# Patient Record
Sex: Male | Born: 1975 | Race: Black or African American | Hispanic: No | Marital: Single | Smoking: Never smoker
Health system: Southern US, Community
[De-identification: ages and names within clinical notes are randomized; demographics above are authoritative.]

## PROBLEM LIST (undated history)

## (undated) DIAGNOSIS — R200 Anesthesia of skin: Secondary | ICD-10-CM

## (undated) DIAGNOSIS — G40802 Other epilepsy, not intractable, without status epilepticus: Secondary | ICD-10-CM

## (undated) HISTORY — DX: Anesthesia of skin: R20.0

## (undated) HISTORY — PX: NO PAST SURGERIES: SHX2092

---

## 2016-01-30 DIAGNOSIS — M542 Cervicalgia: Secondary | ICD-10-CM | POA: Diagnosis not present

## 2016-01-30 DIAGNOSIS — M546 Pain in thoracic spine: Secondary | ICD-10-CM | POA: Diagnosis not present

## 2016-01-30 DIAGNOSIS — E785 Hyperlipidemia, unspecified: Secondary | ICD-10-CM | POA: Diagnosis not present

## 2016-03-15 DIAGNOSIS — R251 Tremor, unspecified: Secondary | ICD-10-CM | POA: Diagnosis not present

## 2016-03-15 DIAGNOSIS — R200 Anesthesia of skin: Secondary | ICD-10-CM | POA: Diagnosis not present

## 2016-03-16 ENCOUNTER — Other Ambulatory Visit: Payer: Self-pay | Admitting: Neurology

## 2016-03-16 DIAGNOSIS — R200 Anesthesia of skin: Secondary | ICD-10-CM

## 2016-04-02 DIAGNOSIS — R200 Anesthesia of skin: Secondary | ICD-10-CM | POA: Diagnosis not present

## 2016-04-06 ENCOUNTER — Ambulatory Visit: Payer: Self-pay

## 2016-04-30 ENCOUNTER — Ambulatory Visit
Admission: RE | Admit: 2016-04-30 | Discharge: 2016-04-30 | Disposition: A | Discharge status: Home / Self Care | Payer: 59 | Source: Ambulatory Visit | Attending: Neurology | Admitting: Neurology

## 2016-04-30 DIAGNOSIS — R200 Anesthesia of skin: Secondary | ICD-10-CM | POA: Insufficient documentation

## 2016-04-30 DIAGNOSIS — R208 Other disturbances of skin sensation: Secondary | ICD-10-CM | POA: Diagnosis not present

## 2016-04-30 MED ORDER — GADOBENATE DIMEGLUMINE 529 MG/ML IV SOLN
15.0000 mL | Freq: Once | INTRAVENOUS | Status: AC | PRN
  Administered 2016-04-30: 15 mL via INTRAVENOUS

## 2016-05-07 DIAGNOSIS — M545 Low back pain: Secondary | ICD-10-CM | POA: Diagnosis not present

## 2016-05-07 DIAGNOSIS — G63 Polyneuropathy in diseases classified elsewhere: Secondary | ICD-10-CM | POA: Diagnosis not present

## 2016-05-07 DIAGNOSIS — E785 Hyperlipidemia, unspecified: Secondary | ICD-10-CM | POA: Diagnosis not present

## 2016-05-14 DIAGNOSIS — R200 Anesthesia of skin: Secondary | ICD-10-CM | POA: Diagnosis not present

## 2016-05-14 DIAGNOSIS — R251 Tremor, unspecified: Secondary | ICD-10-CM | POA: Diagnosis not present

## 2016-05-24 DIAGNOSIS — M542 Cervicalgia: Secondary | ICD-10-CM | POA: Diagnosis not present

## 2016-05-24 DIAGNOSIS — M545 Low back pain: Secondary | ICD-10-CM | POA: Diagnosis not present

## 2016-06-07 DIAGNOSIS — G40109 Localization-related (focal) (partial) symptomatic epilepsy and epileptic syndromes with simple partial seizures, not intractable, without status epilepticus: Secondary | ICD-10-CM | POA: Diagnosis not present

## 2016-06-14 DIAGNOSIS — G6289 Other specified polyneuropathies: Secondary | ICD-10-CM | POA: Diagnosis not present

## 2016-06-14 DIAGNOSIS — E785 Hyperlipidemia, unspecified: Secondary | ICD-10-CM | POA: Diagnosis not present

## 2016-07-05 DIAGNOSIS — M5416 Radiculopathy, lumbar region: Secondary | ICD-10-CM | POA: Diagnosis not present

## 2016-07-05 DIAGNOSIS — M9903 Segmental and somatic dysfunction of lumbar region: Secondary | ICD-10-CM | POA: Diagnosis not present

## 2016-07-05 DIAGNOSIS — M9905 Segmental and somatic dysfunction of pelvic region: Secondary | ICD-10-CM | POA: Diagnosis not present

## 2016-07-05 DIAGNOSIS — M5432 Sciatica, left side: Secondary | ICD-10-CM | POA: Diagnosis not present

## 2016-07-06 DIAGNOSIS — M9903 Segmental and somatic dysfunction of lumbar region: Secondary | ICD-10-CM | POA: Diagnosis not present

## 2016-07-06 DIAGNOSIS — M5416 Radiculopathy, lumbar region: Secondary | ICD-10-CM | POA: Diagnosis not present

## 2016-07-06 DIAGNOSIS — M9905 Segmental and somatic dysfunction of pelvic region: Secondary | ICD-10-CM | POA: Diagnosis not present

## 2016-07-06 DIAGNOSIS — M5432 Sciatica, left side: Secondary | ICD-10-CM | POA: Diagnosis not present

## 2016-07-09 DIAGNOSIS — M9903 Segmental and somatic dysfunction of lumbar region: Secondary | ICD-10-CM | POA: Diagnosis not present

## 2016-07-09 DIAGNOSIS — M5416 Radiculopathy, lumbar region: Secondary | ICD-10-CM | POA: Diagnosis not present

## 2016-07-09 DIAGNOSIS — M5432 Sciatica, left side: Secondary | ICD-10-CM | POA: Diagnosis not present

## 2016-07-09 DIAGNOSIS — M9905 Segmental and somatic dysfunction of pelvic region: Secondary | ICD-10-CM | POA: Diagnosis not present

## 2016-07-11 DIAGNOSIS — M9905 Segmental and somatic dysfunction of pelvic region: Secondary | ICD-10-CM | POA: Diagnosis not present

## 2016-07-11 DIAGNOSIS — M9903 Segmental and somatic dysfunction of lumbar region: Secondary | ICD-10-CM | POA: Diagnosis not present

## 2016-07-11 DIAGNOSIS — M5416 Radiculopathy, lumbar region: Secondary | ICD-10-CM | POA: Diagnosis not present

## 2016-07-11 DIAGNOSIS — M5432 Sciatica, left side: Secondary | ICD-10-CM | POA: Diagnosis not present

## 2016-07-13 DIAGNOSIS — M5416 Radiculopathy, lumbar region: Secondary | ICD-10-CM | POA: Diagnosis not present

## 2016-07-13 DIAGNOSIS — M9903 Segmental and somatic dysfunction of lumbar region: Secondary | ICD-10-CM | POA: Diagnosis not present

## 2016-07-13 DIAGNOSIS — M5432 Sciatica, left side: Secondary | ICD-10-CM | POA: Diagnosis not present

## 2016-07-13 DIAGNOSIS — M9905 Segmental and somatic dysfunction of pelvic region: Secondary | ICD-10-CM | POA: Diagnosis not present

## 2016-07-18 DIAGNOSIS — M9905 Segmental and somatic dysfunction of pelvic region: Secondary | ICD-10-CM | POA: Diagnosis not present

## 2016-07-18 DIAGNOSIS — M5432 Sciatica, left side: Secondary | ICD-10-CM | POA: Diagnosis not present

## 2016-07-18 DIAGNOSIS — M5416 Radiculopathy, lumbar region: Secondary | ICD-10-CM | POA: Diagnosis not present

## 2016-07-18 DIAGNOSIS — M9903 Segmental and somatic dysfunction of lumbar region: Secondary | ICD-10-CM | POA: Diagnosis not present

## 2016-07-19 DIAGNOSIS — M9903 Segmental and somatic dysfunction of lumbar region: Secondary | ICD-10-CM | POA: Diagnosis not present

## 2016-07-19 DIAGNOSIS — M5416 Radiculopathy, lumbar region: Secondary | ICD-10-CM | POA: Diagnosis not present

## 2016-07-19 DIAGNOSIS — M5432 Sciatica, left side: Secondary | ICD-10-CM | POA: Diagnosis not present

## 2016-07-19 DIAGNOSIS — M9905 Segmental and somatic dysfunction of pelvic region: Secondary | ICD-10-CM | POA: Diagnosis not present

## 2016-07-20 DIAGNOSIS — M9903 Segmental and somatic dysfunction of lumbar region: Secondary | ICD-10-CM | POA: Diagnosis not present

## 2016-07-20 DIAGNOSIS — M5432 Sciatica, left side: Secondary | ICD-10-CM | POA: Diagnosis not present

## 2016-07-20 DIAGNOSIS — M5416 Radiculopathy, lumbar region: Secondary | ICD-10-CM | POA: Diagnosis not present

## 2016-07-20 DIAGNOSIS — R200 Anesthesia of skin: Secondary | ICD-10-CM | POA: Diagnosis not present

## 2016-07-20 DIAGNOSIS — M9905 Segmental and somatic dysfunction of pelvic region: Secondary | ICD-10-CM | POA: Diagnosis not present

## 2016-08-21 DIAGNOSIS — R569 Unspecified convulsions: Secondary | ICD-10-CM | POA: Diagnosis not present

## 2016-08-28 DIAGNOSIS — R200 Anesthesia of skin: Secondary | ICD-10-CM | POA: Diagnosis not present

## 2016-09-10 DIAGNOSIS — E785 Hyperlipidemia, unspecified: Secondary | ICD-10-CM | POA: Diagnosis not present

## 2016-09-10 DIAGNOSIS — G63 Polyneuropathy in diseases classified elsewhere: Secondary | ICD-10-CM | POA: Diagnosis not present

## 2016-09-13 DIAGNOSIS — E785 Hyperlipidemia, unspecified: Secondary | ICD-10-CM | POA: Diagnosis not present

## 2016-09-13 DIAGNOSIS — G63 Polyneuropathy in diseases classified elsewhere: Secondary | ICD-10-CM | POA: Diagnosis not present

## 2016-09-17 ENCOUNTER — Ambulatory Visit (INDEPENDENT_AMBULATORY_CARE_PROVIDER_SITE_OTHER): Payer: 59 | Admitting: Neurology

## 2016-09-17 ENCOUNTER — Encounter: Payer: Self-pay | Admitting: Neurology

## 2016-09-17 VITALS — BP 123/77 | HR 77 | Ht 70.0 in | Wt 153.5 lb

## 2016-09-17 DIAGNOSIS — R202 Paresthesia of skin: Secondary | ICD-10-CM | POA: Diagnosis not present

## 2016-09-17 DIAGNOSIS — R200 Anesthesia of skin: Secondary | ICD-10-CM | POA: Diagnosis not present

## 2016-09-17 MED ORDER — DIVALPROEX SODIUM ER 500 MG PO TB24: 500.0000 mg | ORAL_TABLET | Freq: Every day | ORAL | 11 refills | Status: DC

## 2016-09-17 NOTE — Progress Notes (Signed)
PATIENT: Henry Conley DOB: Jul 06, 1976  Chief Complaint  Patient presents with  . Left Leg Numbness    Reports intermittent left leg numbness that started in October 2016.  He has had a normal NCV/EMG and MRI.  He had an abnormal EEG.  He was placed on Gabapentin and Keppra but says he was instructed to stopped both medications.     HISTORICAL  Henry Conley is a 40 years old right-handed male, seen in refer by her primary care doctor Pasty Spillers McLean-Scocuzza for evaluation of intermittent left lower extremity numbness.  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 Marla Roe, I reviewed and summarized office note, laboratory evaluation showed no significant etiology, normal or negative vitamin B12, folic acid, HIV, vitamin D, MRI of the brain in May first 2017 was normal, nerve conduction study in April 2017 was normal, EEG in June 2017 showed presence of left frontal 6-7 Hz theta slowing, but without evidence of epileptiform discharge  Prolonged EEG on August 21 2016 was normal,  He was treated with Keppra up to 1000 mg twice a day without helping his spells, also previously tried gabapentin without helping  RVIEW OF SYSTEMS: Full 14 system review of systems performed and notable only for as above  ALLERGIES: Not on File  HOME MEDICATIONS: Current Outpatient Prescriptions  Medication Sig Dispense Refill  . Multiple Vitamin (MULTI-VITAMINS) TABS Take by mouth.     No current facility-administered medications for this visit.      PAST MEDICAL HISTORY: Past Medical History:  Diagnosis Date  . Left leg numbness     PAST SURGICAL HISTORY: History reviewed. No pertinent surgical history.  FAMILY HISTORY: Family History  Problem Relation Age of Onset  . Heart disease Mother   . Healthy Father     SOCIAL HISTORY:  Social History   Social History  . Marital status: Single    Spouse name: N/A  . Number of children: 0  . Years of education: HS   Occupational History  . Cook at [employer]    Social History Main Topics  . Smoking status: Never Smoker  . Smokeless tobacco: Never Used  . Alcohol use No  . Drug use: No  . Sexual activity: Not on file   Other Topics Concern  . Not on file   Social History Narrative   Lives at home alone.   Right-handed.   Occasional soda.     PHYSICAL EXAM   Vitals:   09/17/16 0916  BP: 123/77  Pulse: 77  Weight: 153 lb 8 oz (69.6 kg)  Height: 5\' 10"  (1.778 m)    Not recorded      Body mass index is 22.02 kg/m.  PHYSICAL EXAMNIATION:  Gen: NAD, conversant, well nourised, obese, well groomed                     Cardiovascular: Regular rate rhythm, no peripheral edema, warm, nontender. Eyes: Conjunctivae clear without exudates or hemorrhage Neck: Supple, no carotid bruise. Pulmonary: Clear to auscultation  bilaterally   NEUROLOGICAL EXAM:  MENTAL STATUS: Speech:    Speech is normal; fluent and spontaneous with normal comprehension.  Cognition:     Orientation to time, place and person     Normal recent and remote memory     Normal Attention span and concentration     Normal Language, naming, repeating,spontaneous speech     Fund of knowledge   CRANIAL NERVES: CN II: Visual fields are full to confrontation. Fundoscopic exam is normal with sharp discs and no vascular changes. Pupils are round equal and briskly reactive to light. CN III, IV, VI: extraocular movement are normal. No ptosis. CN V: Facial sensation is intact to pinprick  in all 3 divisions bilaterally. Corneal responses are intact.  CN VII: Face is symmetric with normal eye closure and smile. CN VIII: Hearing is normal to rubbing fingers CN IX, X: Palate elevates symmetrically. Phonation is normal. CN XI: Head turning and shoulder shrug are intact CN XII: Tongue is midline with normal movements and no atrophy.  MOTOR: There is no pronator drift of out-stretched arms. Muscle bulk and tone are normal. Muscle strength is normal.  REFLEXES: Reflexes are 2+ and symmetric at the biceps, triceps, knees, and ankles. Plantar responses are flexor.  SENSORY: Intact to light touch, pinprick, positional sensation and vibratory sensation are intact in fingers and toes.  COORDINATION: Rapid alternating movements and fine finger movements are intact. There is no dysmetria on finger-to-nose and heel-knee-shin.    GAIT/STANCE: Posture is normal. Gait is steady with normal steps, base, arm swing, and turning. Heel and toe walking are normal. Tandem gait is normal.  Romberg is absent.   DIAGNOSTIC DATA (LABS, IMAGING, TESTING) - I reviewed patient records, labs, notes, testing and imaging myself where available.   ASSESSMENT AND PLAN  Henry Conley is a 40 y.o. male   Recurrent episode of transient left leg numbness  Differentiation diagnosis simple seizure versus left lumbar radiculopathy  Will try Depakote ER 500 mg every day  MRI of lumbar  Levert FeinsteinYijun Jamael Hoffmann, M.D. Ph.D.  [facility] [address], [address] [place], [place] [address] Ph: [phone]) [phone] Fax: [phone]  ZH:YQMVHCC:Tracy N McLean-Scocuzza, MD

## 2016-09-30 ENCOUNTER — Encounter: Payer: Self-pay | Admitting: Emergency Medicine

## 2016-09-30 ENCOUNTER — Inpatient Hospital Stay
Admission: EM | Admit: 2016-09-30 | Discharge: 2016-10-02 | DRG: 100 | Disposition: A | Discharge status: Home / Self Care | Payer: 59 | Attending: Internal Medicine | Admitting: Internal Medicine

## 2016-09-30 ENCOUNTER — Emergency Department: Payer: 59

## 2016-09-30 DIAGNOSIS — G9340 Encephalopathy, unspecified: Secondary | ICD-10-CM | POA: Diagnosis present

## 2016-09-30 DIAGNOSIS — R4182 Altered mental status, unspecified: Secondary | ICD-10-CM | POA: Diagnosis not present

## 2016-09-30 DIAGNOSIS — G40909 Epilepsy, unspecified, not intractable, without status epilepticus: Secondary | ICD-10-CM | POA: Diagnosis not present

## 2016-09-30 DIAGNOSIS — R7309 Other abnormal glucose: Secondary | ICD-10-CM | POA: Diagnosis not present

## 2016-09-30 DIAGNOSIS — R410 Disorientation, unspecified: Secondary | ICD-10-CM | POA: Diagnosis not present

## 2016-09-30 DIAGNOSIS — E875 Hyperkalemia: Secondary | ICD-10-CM | POA: Diagnosis not present

## 2016-09-30 DIAGNOSIS — G40802 Other epilepsy, not intractable, without status epilepticus: Secondary | ICD-10-CM

## 2016-09-30 DIAGNOSIS — I6783 Posterior reversible encephalopathy syndrome: Secondary | ICD-10-CM | POA: Diagnosis not present

## 2016-09-30 DIAGNOSIS — R200 Anesthesia of skin: Secondary | ICD-10-CM | POA: Diagnosis present

## 2016-09-30 DIAGNOSIS — G9349 Other encephalopathy: Secondary | ICD-10-CM | POA: Diagnosis not present

## 2016-09-30 LAB — COMPREHENSIVE METABOLIC PANEL
ALK PHOS: 65 U/L (ref 38–126)
ALT: 37 U/L (ref 17–63)
ANION GAP: 8 (ref 5–15)
AST: 39 U/L (ref 15–41)
Albumin: 4.3 g/dL (ref 3.5–5.0)
BUN: 11 mg/dL (ref 6–20)
CALCIUM: 9.5 mg/dL (ref 8.9–10.3)
CHLORIDE: 106 mmol/L (ref 101–111)
CO2: 24 mmol/L (ref 22–32)
Creatinine, Ser: 1 mg/dL (ref 0.61–1.24)
Glucose, Bld: 115 mg/dL — ABNORMAL HIGH (ref 65–99)
Potassium: 6.9 mmol/L (ref 3.5–5.1)
SODIUM: 138 mmol/L (ref 135–145)
Total Bilirubin: 1.6 mg/dL — ABNORMAL HIGH (ref 0.3–1.2)
Total Protein: 8.3 g/dL — ABNORMAL HIGH (ref 6.5–8.1)

## 2016-09-30 LAB — CBC WITH DIFFERENTIAL/PLATELET
Basophils Absolute: 0.1 10*3/uL (ref 0–0.1)
Basophils Relative: 1 %
EOS ABS: 0.9 10*3/uL — AB (ref 0–0.7)
EOS PCT: 14 %
HCT: 53.1 % — ABNORMAL HIGH (ref 40.0–52.0)
Hemoglobin: 18 g/dL (ref 13.0–18.0)
LYMPHS ABS: 2 10*3/uL (ref 1.0–3.6)
Lymphocytes Relative: 33 %
MCH: 32.1 pg (ref 26.0–34.0)
MCHC: 33.9 g/dL (ref 32.0–36.0)
MCV: 94.7 fL (ref 80.0–100.0)
MONOS PCT: 9 %
Monocytes Absolute: 0.5 10*3/uL (ref 0.2–1.0)
Neutro Abs: 2.5 10*3/uL (ref 1.4–6.5)
Neutrophils Relative %: 43 %
PLATELETS: 243 10*3/uL (ref 150–440)
RBC: 5.6 MIL/uL (ref 4.40–5.90)
RDW: 12.7 % (ref 11.5–14.5)
WBC: 5.9 10*3/uL (ref 3.8–10.6)

## 2016-09-30 LAB — BLOOD GAS, VENOUS
Acid-Base Excess: 5.8 mmol/L — ABNORMAL HIGH (ref 0.0–2.0)
BICARBONATE: 31.2 mmol/L — AB (ref 20.0–28.0)
PH VEN: 7.43 (ref 7.250–7.430)
Patient temperature: 37
pCO2, Ven: 47 mmHg (ref 44.0–60.0)
pO2, Ven: <31 mmHg — CL (ref 32.0–45.0)

## 2016-09-30 LAB — SEDIMENTATION RATE: Sed Rate: 13 mm/hr (ref 0–15)

## 2016-09-30 LAB — BASIC METABOLIC PANEL
ANION GAP: 8 (ref 5–15)
BUN: 11 mg/dL (ref 6–20)
CALCIUM: 9.6 mg/dL (ref 8.9–10.3)
CO2: 27 mmol/L (ref 22–32)
Chloride: 105 mmol/L (ref 101–111)
Creatinine, Ser: 0.96 mg/dL (ref 0.61–1.24)
GFR calc non Af Amer: >60 mL/min (ref >60)
GLUCOSE: 62 mg/dL — AB (ref 65–99)
POTASSIUM: 3.8 mmol/L (ref 3.5–5.1)
Sodium: 140 mmol/L (ref 135–145)

## 2016-09-30 LAB — RAPID HIV SCREEN (HIV 1/2 AB+AG)
HIV 1/2 ANTIBODIES: NONREACTIVE
HIV-1 P24 Antigen - HIV24: NONREACTIVE

## 2016-09-30 LAB — GLUCOSE, CAPILLARY
GLUCOSE-CAPILLARY: 101 mg/dL — AB (ref 65–99)
Glucose-Capillary: 104 mg/dL — ABNORMAL HIGH (ref 65–99)
Glucose-Capillary: 77 mg/dL (ref 65–99)
Glucose-Capillary: 121 mg/dL — ABNORMAL HIGH (ref 65–99)

## 2016-09-30 LAB — TROPONIN I

## 2016-09-30 LAB — AMMONIA: AMMONIA: 31 umol/L (ref 9–35)

## 2016-09-30 LAB — CK: Total CK: 220 U/L (ref 49–397)

## 2016-09-30 MED ORDER — ACETAMINOPHEN 650 MG RE SUPP: 650.0000 mg | Freq: Four times a day (QID) | RECTAL | Status: DC | PRN

## 2016-09-30 MED ORDER — ONDANSETRON HCL 4 MG PO TABS: 4.0000 mg | ORAL_TABLET | Freq: Four times a day (QID) | ORAL | Status: DC | PRN

## 2016-09-30 MED ORDER — ASPIRIN EC 81 MG PO TBEC
81.0000 mg | DELAYED_RELEASE_TABLET | Freq: Every day | ORAL | Status: DC
  Administered 2016-09-30 – 2016-10-02 (×3): 81 mg via ORAL
  Filled 2016-09-30 (×3): qty 1

## 2016-09-30 MED ORDER — BISACODYL 10 MG RE SUPP: 10.0000 mg | Freq: Every day | RECTAL | Status: DC | PRN

## 2016-09-30 MED ORDER — PANTOPRAZOLE SODIUM 40 MG PO TBEC
40.0000 mg | DELAYED_RELEASE_TABLET | Freq: Every day | ORAL | Status: DC
  Administered 2016-09-30 – 2016-10-02 (×3): 40 mg via ORAL
  Filled 2016-09-30 (×3): qty 1

## 2016-09-30 MED ORDER — ONDANSETRON HCL 4 MG/2ML IJ SOLN: 4.0000 mg | Freq: Four times a day (QID) | INTRAMUSCULAR | Status: DC | PRN

## 2016-09-30 MED ORDER — ALBUTEROL SULFATE (2.5 MG/3ML) 0.083% IN NEBU
5.0000 mg | INHALATION_SOLUTION | Freq: Once | RESPIRATORY_TRACT | Status: AC
  Administered 2016-09-30: 5 mg via RESPIRATORY_TRACT
  Filled 2016-09-30: qty 6

## 2016-09-30 MED ORDER — INSULIN ASPART 100 UNIT/ML ~~LOC~~ SOLN
6.0000 [IU] | Freq: Once | SUBCUTANEOUS | Status: AC
  Administered 2016-09-30: 6 [IU] via INTRAVENOUS
  Filled 2016-09-30: qty 6

## 2016-09-30 MED ORDER — SODIUM CHLORIDE 0.9 % IV SOLN
1000.0000 mg | Freq: Once | INTRAVENOUS | Status: AC
  Administered 2016-09-30: 1000 mg via INTRAVENOUS
  Filled 2016-09-30: qty 20

## 2016-09-30 MED ORDER — DOCUSATE SODIUM 100 MG PO CAPS
100.0000 mg | ORAL_CAPSULE | Freq: Two times a day (BID) | ORAL | Status: DC
  Administered 2016-09-30 – 2016-10-02 (×4): 100 mg via ORAL
  Filled 2016-09-30 (×4): qty 1

## 2016-09-30 MED ORDER — ADULT MULTIVITAMIN W/MINERALS CH
1.0000 | ORAL_TABLET | Freq: Every morning | ORAL | Status: DC
  Administered 2016-10-01 – 2016-10-02 (×2): 1 via ORAL
  Filled 2016-09-30 (×2): qty 1

## 2016-09-30 MED ORDER — KCL IN DEXTROSE-NACL 20-5-0.45 MEQ/L-%-% IV SOLN
Freq: Once | INTRAVENOUS | Status: AC
  Administered 2016-09-30: 13:00:00 via INTRAVENOUS
  Filled 2016-09-30: qty 1000

## 2016-09-30 MED ORDER — DEXTROSE-NACL 5-0.9 % IV SOLN
INTRAVENOUS | Status: DC
  Administered 2016-09-30 – 2016-10-01 (×3): via INTRAVENOUS
  Administered 2016-10-02: 1000 mL via INTRAVENOUS

## 2016-09-30 MED ORDER — ACETAMINOPHEN 325 MG PO TABS: 650.0000 mg | ORAL_TABLET | Freq: Four times a day (QID) | ORAL | Status: DC | PRN

## 2016-09-30 MED ORDER — SODIUM CHLORIDE 0.9 % IV SOLN: INTRAVENOUS | Status: DC

## 2016-09-30 MED ORDER — HEPARIN SODIUM (PORCINE) 5000 UNIT/ML IJ SOLN
5000.0000 [IU] | Freq: Three times a day (TID) | INTRAMUSCULAR | Status: DC
  Administered 2016-09-30: 5000 [IU] via SUBCUTANEOUS
  Filled 2016-09-30 (×3): qty 1

## 2016-09-30 MED ORDER — LORAZEPAM 2 MG/ML IJ SOLN
1.0000 mg | INTRAMUSCULAR | Status: DC | PRN
  Filled 2016-09-30: qty 1

## 2016-09-30 MED ORDER — SODIUM CHLORIDE 0.9% FLUSH
3.0000 mL | Freq: Two times a day (BID) | INTRAVENOUS | Status: DC
  Administered 2016-10-01 (×2): 3 mL via INTRAVENOUS

## 2016-09-30 MED ORDER — INSULIN ASPART 100 UNIT/ML ~~LOC~~ SOLN
0.0000 [IU] | Freq: Three times a day (TID) | SUBCUTANEOUS | Status: DC
  Administered 2016-09-30: 1 [IU] via SUBCUTANEOUS
  Filled 2016-09-30: qty 1

## 2016-09-30 NOTE — ED Triage Notes (Signed)
States had been getting treated for possible seizures for the last 6 months. Was told not seizures so stopped medication per family. Pt confused, a&ox2.

## 2016-09-30 NOTE — ED Notes (Signed)
Pt given apple juice  

## 2016-09-30 NOTE — ED Notes (Signed)
Pt unable to comprehend some commands; has blank stare when asked to push against this nurse's hands. Even showing him has no impact. Pt is calm and cooperative.l

## 2016-09-30 NOTE — H&P (Signed)
History and Physical    Henry Conley ZOX:[id number] DOB: 09/19/1976 DOA: 09/30/2016  Referring physician: Dr. Roxan Hockey PCP: Henry Spillers McLean-Scocuzza, MD  Specialists: none  Chief Complaint: altered mental status  HPI: Henry Conley is a 40 y.o. male has a past medical history significant for questionable seizures and LLE numbness who is brought to the ER by his mother for 1-2 day hx of altered mental status. The patient is awake and alert but unable to follow commands. He does answer some questions appropriately. Mother states she has noticed some seizure-like activity and the pt did c/o headache yesterday. In the ER, head CT was OK. K+ was elevated at 6.9. Ammonia also mildly elevated. He is now admitted.  Review of Systems: The patient denies anorexia, fever, weight loss,, vision loss, decreased hearing, hoarseness, chest pain, syncope, dyspnea on exertion, peripheral edema, balance deficits, hemoptysis, abdominal pain, melena, hematochezia, severe indigestion/heartburn, hematuria, incontinence, genital sores, muscle weakness, suspicious skin lesions, transient blindness, difficulty walking, depression, unusual weight change, abnormal bleeding, enlarged lymph nodes, angioedema, and breast masses.   Past Medical History:  Diagnosis Date  . Left leg numbness   . Seizures (HCC)    History reviewed. No pertinent surgical history. Social History:  reports that he has never smoked. He has never used smokeless tobacco. He reports that he does not drink alcohol or use drugs.  No Known Allergies  Family History  Problem Relation Age of Onset  . Heart disease Mother   . Healthy Father     Prior to Admission medications   Medication Sig Start Date End Date Taking? Authorizing Provider  Multiple Vitamin (MULTI-VITAMINS) TABS Take 1 tablet by mouth every morning.    Yes Historical Provider, MD  divalproex (DEPAKOTE ER) 500 MG 24 hr tablet Take 1 tablet (500 mg total) by mouth  daily. Patient not taking: Reported on 09/30/2016 09/17/16   Levert Feinstein, MD   Physical Exam: Vitals:   09/30/16 1116 09/30/16 1334  BP: (!) 123/95 133/82  Pulse: 70 67  Resp: 18 14  Temp: 98.7 F (37.1 C)   TempSrc: Oral   SpO2: 100% 98%  Weight: 72.6 kg (160 lb)   Height: 5\' 10"  (1.778 m)      General:  No apparent distress, WDWN, [place]/AT  Eyes: PERRL, EOMI, no scleral icterus, conjunctiva clear  ENT: moist oropharynx without exudate, TM's benign, dentition good  Neck: supple, no lymphadenopathy. No bruits or thyromegaly  Cardiovascular: regular rate without MRG; 2+ peripheral pulses, no JVD, no peripheral edema  Respiratory: CTA biL, good air movement without wheezing, rhonchi or crackled. Respiratory effort normal  Abdomen: soft, non tender to palpation, positive bowel sounds, no guarding, no rebound  Skin: no rashes or lesions  Musculoskeletal: normal bulk and tone, no joint swelling  Psychiatric: alert, oriented to person/place but not time. Does not follow simple commands. Moves all extremities spontaneously and against gravity. No focal sensory deficits. DTR's symmetric.  Neurologic: CN 2-12 grossly intact, Motor strength 5/5 in all 4 groups  Labs on Admission:  Basic Metabolic Panel:  Recent Labs Lab 09/30/16 1137 09/30/16 1329  NA 138 140  K 6.9* 3.8  CL 106 105  CO2 24 27  GLUCOSE 115* 62*  BUN 11 11  CREATININE 1.00 0.96  CALCIUM 9.5 9.6   Liver Function Tests:  Recent Labs Lab 09/30/16 1137  AST 39  ALT 37  ALKPHOS 65  BILITOT 1.6*  PROT 8.3*  ALBUMIN 4.3   No results  for input(s): LIPASE, AMYLASE in the last 168 hours.  Recent Labs Lab 09/30/16 1137  AMMONIA 31   CBC:  Recent Labs Lab 09/30/16 1137  WBC 5.9  NEUTROABS 2.5  HGB 18.0  HCT 53.1*  MCV 94.7  PLT 243   Cardiac Enzymes:  Recent Labs Lab 09/30/16 1137  CKTOTAL 220  TROPONINI <0.03    BNP (last 3 results) No results for input(s): BNP in the last 8760  hours.  ProBNP (last 3 results) No results for input(s): PROBNP in the last 8760 hours.  CBG:  Recent Labs Lab 09/30/16 1122  GLUCAP 104*    Radiological Exams on Admission: Dg Chest 2 View  Result Date: 09/30/2016 CLINICAL DATA:  Non smoker, confusion today EXAM: CHEST  2 VIEW COMPARISON:  None. FINDINGS: The heart size and mediastinal contours are within normal limits. Both lungs are clear. The visualized skeletal structures are unremarkable. IMPRESSION: No active cardiopulmonary disease.  No evidence of pneumonia. Electronically Signed   By: Bary RichardStan  Maynard M.D.   On: 09/30/2016 12:24   Ct Head Wo Contrast  Result Date: 09/30/2016 CLINICAL DATA:  Poor historian.  Questionable history of seizure. EXAM: CT HEAD WITHOUT CONTRAST TECHNIQUE: Contiguous axial images were obtained from the base of the skull through the vertex without intravenous contrast. COMPARISON:  Brain MRI - 04/30/2016 FINDINGS: Brain: Gray-white differentiation is maintained. No CT evidence of acute large territory infarct. No intraparenchymal or extra-axial mass or hemorrhage. Normal size and configuration of the ventricles and the basilar cisterns. No midline shift peer Vascular: No hyperdense vessel or unexpected calcification. Skull: Normal. Negative for fracture or focal lesion. Sinuses/Orbits: Limited visualization the paranasal sinuses and mastoid air cells is normal. No air-fluid levels. Other: Regional soft tissues appear normal. IMPRESSION: Negative noncontrast head CT. Electronically Signed   By: Simonne ComeJohn  Watts M.D.   On: 09/30/2016 12:24    EKG: Independently reviewed.  Assessment/Plan Principal Problem:   Encephalopathy acute Active Problems:   Hyperkalemia   Seizure disorder (HCC)   Abnormal glucose   Will admit to floor with frequent neuro checks. Correct K+ and redraw labs after treatment. Follow sugars. Load with Dilantin. Order MRI of brain and Neuro consult. Hold off on ABX for now. IV Ativan as  needed for seizures/agitation. Repeat labs in AM.  Diet: regular Fluids: D5NS@100  DVT Prophylaxis: SQ Heparin  Code Status: FULL  Family Communication: yes  Disposition Plan: home  Time spent: 50 min

## 2016-09-30 NOTE — ED Notes (Signed)
Pt responds to questions regarding self and time; unable to tell me why he is here. He states he doesn't "feel good" but can't articulate any further. Mother states that he has been altered all morning. He was put on seizure medication a few months ago, but the provider has determined that he is not having seizures, so took him off the medications. Mother denies use of drugs, alcohol.

## 2016-09-30 NOTE — ED Provider Notes (Signed)
[facility] Emergency Department Provider Note    None    (approximate)  I have reviewed the triage vital signs and the nursing notes.   HISTORY  Chief Complaint Altered Mental Status  Level V caveat: AMS  HPI Henry Conley is a 40 y.o. male who presents with acute encephalopathy that started yesterday.Patient reportedly has been treated for seizures with Keppra and Depakote recently stopped that after he had EEG done which showed no evidence of epileptiform changes. Starting yesterday of family members and staff here at the hospital thatpatient will state that he's been encephalopathic. States he's been more withdrawn and less conversant. Patient is typically very social and interactive. The mother is here in the ER with patient denies any fevers. No headaches, nausea or vomiting. No reported chest pain. No reported polysubstance abuse. Patient does not drink.   Past Medical History:  Diagnosis Date  . Left leg numbness   . Seizures [facility])     Patient Active Problem List   Diagnosis Date Noted  . Encephalopathy acute 09/30/2016  . Hyperkalemia 09/30/2016  . Seizure disorder (HCC) 09/30/2016  . Abnormal glucose 09/30/2016    History reviewed. No pertinent surgical history.  Prior to Admission medications   Medication Sig Start Date End Date Taking? Authorizing Provider  Multiple Vitamin (MULTI-VITAMINS) TABS Take 1 tablet by mouth every morning.    Yes Historical Provider, MD  divalproex (DEPAKOTE ER) 500 MG 24 hr tablet Take 1 tablet (500 mg total) by mouth daily. Patient not taking: Reported on 09/30/2016 09/17/16   Levert Feinstein, MD    Allergies Review of patient's allergies indicates no known allergies.  Family History  Problem Relation Age of Onset  . Heart disease Mother   . Healthy Father     Social History Social History  Substance Use Topics  . Smoking status: Never Smoker  . Smokeless tobacco: Never Used  . Alcohol use No     Review of Systems Unable to obtain due to acute encephalopathy ____________________________________________   PHYSICAL EXAM:  VITAL SIGNS: Vitals:   09/30/16 1515 09/30/16 1530  BP:    Pulse:    Resp: 12 15  Temp:      Constitutional: Alert But acutely encephalopathic. Patient nontoxic appearing Eyes: Conjunctivae are normal. PERRL. EOMI. Head: Atraumatic. Nose: No congestion/rhinnorhea. Mouth/Throat: Mucous membranes are moist.  Oropharynx non-erythematous. Neck: No stridor. Painless ROM. No cervical spine tenderness to palpation Hematological/Lymphatic/Immunilogical: No cervical lymphadenopathy. Cardiovascular: Normal rate, regular rhythm. Grossly normal heart sounds.  Good peripheral circulation. Respiratory: Normal respiratory effort.  No retractions. Lungs CTAB. Gastrointestinal: Soft and nontender. No distention. No abdominal bruits. No CVA tenderness. Genitourinary:  Musculoskeletal: No lower extremity tenderness nor edema.  No joint effusions. Neurologic: Speaking only and brief sentences. Patient having trouble following commands. Moving all extremities. No facial droop. Extraocular motions intact. Pupils are 3 mm and reactive bilaterally. No obvious cranial nerve deficit. Patient able to ambulate with a steady gait. Skin:  Skin is warm, dry and intact. No rash noted. Psychiatric: Patient withdrawn  ____________________________________________   LABS (all labs ordered are listed, but only abnormal results are displayed)  Results for orders placed or performed during the hospital encounter of 09/30/16 (from the past 24 hour(s))  Glucose, capillary     Status: Abnormal   Collection Time: 09/30/16 11:22 AM  Result Value Ref Range   Glucose-Capillary 104 (H) 65 - 99 mg/dL  CBC with Differential/Platelet     Status: Abnormal  Collection Time: 09/30/16 11:37 AM  Result Value Ref Range   WBC 5.9 3.8 - 10.6 K/uL   RBC 5.60 4.40 - 5.90 MIL/uL   Hemoglobin 18.0 13.0  - 18.0 g/dL   HCT 16.1 (H) 09.6 - 04.5 %   MCV 94.7 80.0 - 100.0 fL   MCH 32.1 26.0 - 34.0 pg   MCHC 33.9 32.0 - 36.0 g/dL   RDW 40.9 81.1 - 91.4 %   Platelets 243 150 - 440 K/uL   Neutrophils Relative % 43 %   Neutro Abs 2.5 1.4 - 6.5 K/uL   Lymphocytes Relative 33 %   Lymphs Abs 2.0 1.0 - 3.6 K/uL   Monocytes Relative 9 %   Monocytes Absolute 0.5 0.2 - 1.0 K/uL   Eosinophils Relative 14 %   Eosinophils Absolute 0.9 (H) 0 - 0.7 K/uL   Basophils Relative 1 %   Basophils Absolute 0.1 0 - 0.1 K/uL  Comprehensive metabolic panel     Status: Abnormal   Collection Time: 09/30/16 11:37 AM  Result Value Ref Range   Sodium 138 135 - 145 mmol/L   Potassium 6.9 (HH) 3.5 - 5.1 mmol/L   Chloride 106 101 - 111 mmol/L   CO2 24 22 - 32 mmol/L   Glucose, Bld 115 (H) 65 - 99 mg/dL   BUN 11 6 - 20 mg/dL   Creatinine, Ser 7.82 0.61 - 1.24 mg/dL   Calcium 9.5 8.9 - 95.6 mg/dL   Total Protein 8.3 (H) 6.5 - 8.1 g/dL   Albumin 4.3 3.5 - 5.0 g/dL   AST 39 15 - 41 U/L   ALT 37 17 - 63 U/L   Alkaline Phosphatase 65 38 - 126 U/L   Total Bilirubin 1.6 (H) 0.3 - 1.2 mg/dL   GFR calc non Af Amer >60 >60 mL/min   GFR calc Af Amer >60 >60 mL/min   Anion gap 8 5 - 15  Troponin I     Status: None   Collection Time: 09/30/16 11:37 AM  Result Value Ref Range   Troponin I <0.03 <0.03 ng/mL  Ammonia     Status: None   Collection Time: 09/30/16 11:37 AM  Result Value Ref Range   Ammonia 31 9 - 35 umol/L  CK     Status: None   Collection Time: 09/30/16 11:37 AM  Result Value Ref Range   Total CK 220 49 - 397 U/L  Basic metabolic panel     Status: Abnormal   Collection Time: 09/30/16  1:29 PM  Result Value Ref Range   Sodium 140 135 - 145 mmol/L   Potassium 3.8 3.5 - 5.1 mmol/L   Chloride 105 101 - 111 mmol/L   CO2 27 22 - 32 mmol/L   Glucose, Bld 62 (L) 65 - 99 mg/dL   BUN 11 6 - 20 mg/dL   Creatinine, Ser 2.13 0.61 - 1.24 mg/dL   Calcium 9.6 8.9 - 08.6 mg/dL   GFR calc non Af Amer >60 >60 mL/min    GFR calc Af Amer >60 >60 mL/min   Anion gap 8 5 - 15  Blood gas, venous     Status: Abnormal   Collection Time: 09/30/16  1:39 PM  Result Value Ref Range   pH, Ven 7.43 7.250 - 7.430   pCO2, Ven 47 44.0 - 60.0 mmHg   pO2, Ven <31.0 (LL) 32.0 - 45.0 mmHg   Bicarbonate 31.2 (H) 20.0 - 28.0 mmol/L   Acid-Base Excess 5.8 (H) 0.0 -  2.0 mmol/L   Patient temperature 37.0    Collection site VEIN    Sample type VENOUS   Glucose, capillary     Status: None   Collection Time: 09/30/16  2:15 PM  Result Value Ref Range   Glucose-Capillary 77 65 - 99 mg/dL   ____________________________________________  EKG My review and personal interpretation at Time: 11:35   Indication: ams  Rate: 70  Rhythm: normal Axis: mpr,a; Other: BER, no reciprocal depressions to suggest acute ischemia ____________________________________________  RADIOLOGY I personally reviewed all radiographic images ordered to evaluate for the above acute complaints and reviewed radiology reports and findings.  These findings were personally discussed with the patient.  Please see medical record for radiology report.  ____________________________________________   PROCEDURES  Procedure(s) performed: none    Critical Care performed: no ____________________________________________   INITIAL IMPRESSION / ASSESSMENT AND PLAN / ED COURSE  Pertinent labs & imaging results that were available during my care of the patient were reviewed by me and considered in my medical decision making (see chart for details).  DDX: Dehydration, sepsis, pna, uti, hypoglycemia, cva, drug effect, withdrawal, encephalitis   Lawanna KobusCarlos O Conley is a 40 y.o. who presents to the ED with Acute encephalopathy. No reported trauma. Patient with very bizarre-appearing sensorium. No focal neurologic deficits other than this bizarre confusion. Patient afebrile and otherwise he will dynamically stable. Do not feel this is consistent with acute infectious  process. Patient has been extensively evaluated for seizure disorder with multiple medication changes in the past year. This possible withdrawal. Possible subclinical status though feel this is less likely. CT imaging will be ordered to evaluate for CVA or bleed.  Other differentials do include demyelinating disease as well as underlying psychiatric illness.  The patient will be placed on continuous pulse oximetry and telemetry for monitoring.  Laboratory evaluation will be sent to evaluate for the above complaints.      Clinical Course  Comment By Time  CT head without acute abnormality. We will further evaluate for acute encephalopathy with MRI brain Willy EddyPatrick Columbia Pandey, MD 10/01 1236  Patient noted to have hyperkalemia. No EKG changes. We'll treat with IV potassium and albuterol. Patient still encephalopathic. Etiology of hyperkalemia is uncertain as the patient has normal renal function at this time. Etiology of his encephalopathy remains uncertain. Patient afebrile with no meningismus do not feel lumbar puncture emergently indicated at this time.  I do feel patient will require admission for further evaluation and management.  Have discussed with the patient and available family all diagnostics and treatments performed thus far and all questions were answered to the best of my ability. The patient demonstrates understanding and agreement with plan.  Willy EddyPatrick Iran Rowe, MD 10/01 1321     ____________________________________________   FINAL CLINICAL IMPRESSION(S) / ED DIAGNOSES  Final diagnoses:  Encephalopathy acute      NEW MEDICATIONS STARTED DURING THIS VISIT:  New Prescriptions   No medications on file     Note:  This document was prepared using Dragon voice recognition software and may include unintentional dictation errors.    Willy EddyPatrick Joniyah Mallinger, MD 09/30/16 [phone]

## 2016-10-01 ENCOUNTER — Inpatient Hospital Stay (HOSPITAL_COMMUNITY): Payer: 59

## 2016-10-01 ENCOUNTER — Inpatient Hospital Stay: Payer: 59

## 2016-10-01 DIAGNOSIS — G9340 Encephalopathy, unspecified: Secondary | ICD-10-CM

## 2016-10-01 DIAGNOSIS — I6783 Posterior reversible encephalopathy syndrome: Secondary | ICD-10-CM

## 2016-10-01 LAB — CBC
HCT: 47.6 % (ref 40.0–52.0)
Hemoglobin: 16.4 g/dL (ref 13.0–18.0)
MCH: 32.3 pg (ref 26.0–34.0)
MCHC: 34.4 g/dL (ref 32.0–36.0)
MCV: 93.8 fL (ref 80.0–100.0)
Platelets: 208 10*3/uL (ref 150–440)
RBC: 5.07 MIL/uL (ref 4.40–5.90)
RDW: 12.6 % (ref 11.5–14.5)
WBC: 5.1 10*3/uL (ref 3.8–10.6)

## 2016-10-01 LAB — COMPREHENSIVE METABOLIC PANEL
ALT: 29 U/L (ref 17–63)
AST: 20 U/L (ref 15–41)
Albumin: 3.9 g/dL (ref 3.5–5.0)
Alkaline Phosphatase: 59 U/L (ref 38–126)
Anion gap: 8 (ref 5–15)
BILIRUBIN TOTAL: 1.2 mg/dL (ref 0.3–1.2)
BUN: 9 mg/dL (ref 6–20)
CALCIUM: 9.1 mg/dL (ref 8.9–10.3)
CHLORIDE: 109 mmol/L (ref 101–111)
CO2: 24 mmol/L (ref 22–32)
CREATININE: 0.85 mg/dL (ref 0.61–1.24)
Glucose, Bld: 104 mg/dL — ABNORMAL HIGH (ref 65–99)
Potassium: 3.9 mmol/L (ref 3.5–5.1)
Sodium: 141 mmol/L (ref 135–145)
TOTAL PROTEIN: 7.3 g/dL (ref 6.5–8.1)

## 2016-10-01 LAB — URINE DRUG SCREEN, QUALITATIVE (ARMC ONLY)
AMPHETAMINES, UR SCREEN: NOT DETECTED
BENZODIAZEPINE, UR SCRN: NOT DETECTED
Barbiturates, Ur Screen: NOT DETECTED
CANNABINOID 50 NG, UR ~~LOC~~: NOT DETECTED
COCAINE METABOLITE, UR ~~LOC~~: NOT DETECTED
MDMA (ECSTASY) UR SCREEN: NOT DETECTED
Methadone Scn, Ur: NOT DETECTED
Opiate, Ur Screen: NOT DETECTED
PHENCYCLIDINE (PCP) UR S: NOT DETECTED
TRICYCLIC, UR SCREEN: NOT DETECTED

## 2016-10-01 LAB — PHENYTOIN LEVEL, TOTAL: PHENYTOIN LVL: 10.5 ug/mL (ref 10.0–20.0)

## 2016-10-01 LAB — GLUCOSE, CAPILLARY
GLUCOSE-CAPILLARY: 112 mg/dL — AB (ref 65–99)
GLUCOSE-CAPILLARY: 108 mg/dL — AB (ref 65–99)
Glucose-Capillary: 110 mg/dL — ABNORMAL HIGH (ref 65–99)
Glucose-Capillary: 111 mg/dL — ABNORMAL HIGH (ref 65–99)

## 2016-10-01 LAB — VITAMIN B12: VITAMIN B 12: 484 pg/mL (ref 180–914)

## 2016-10-01 MED ORDER — DIVALPROEX SODIUM 500 MG PO DR TAB: 500.0000 mg | DELAYED_RELEASE_TABLET | Freq: Two times a day (BID) | ORAL | Status: DC

## 2016-10-01 MED ORDER — PHENYTOIN SODIUM EXTENDED 100 MG PO CAPS
300.0000 mg | ORAL_CAPSULE | Freq: Every day | ORAL | Status: DC
  Administered 2016-10-01: 300 mg via ORAL
  Filled 2016-10-01: qty 3

## 2016-10-01 MED ORDER — GADOBENATE DIMEGLUMINE 529 MG/ML IV SOLN
15.0000 mL | Freq: Once | INTRAVENOUS | Status: AC | PRN
  Administered 2016-10-01: 13 mL via INTRAVENOUS

## 2016-10-01 MED ORDER — LORAZEPAM 2 MG/ML IJ SOLN
1.0000 mg | Freq: Once | INTRAMUSCULAR | Status: AC
  Administered 2016-10-01: 1 mg via INTRAVENOUS

## 2016-10-01 MED ORDER — ENOXAPARIN SODIUM 40 MG/0.4ML ~~LOC~~ SOLN: 40.0000 mg | SUBCUTANEOUS | Status: DC

## 2016-10-01 NOTE — Consult Note (Signed)
Reason for Consult:Altered mental status Referring Physician: Elisabeth PigeonVachhani  CC: Confusion  HPI: Henry Conley is an 40 y.o. male who has been followed since March for episodes of left leg numbness.  Mother describes them as leg gets numb then patient rises off chair and is altered for a short period of time before coming back to baseline.  Work up has included normal or negative vitamin B12, folic acid, HIV, vitamin D, MRI of the brain in May first 2017 was normal, nerve conduction study in April 2017 was normal, EEG in June 2017 showed presence of left frontal 6-7 Hz theta slowing, but without evidence of epileptiform discharge.  Patient was initially placed on Keppra and dose increased to 1000mg  BID with no improvement.  This was discontinued and patient recently started on Depakote.  This was discontinued on this admission and patient started on Dilantin.   Per mother's report patient noted to have some of these episodes prior to admission.  Was found to be confused by her and her son.  Has not returned to baseline.   Past Medical History:  Diagnosis Date  . Left leg numbness   . Seizures (HCC)     History reviewed. No pertinent surgical history.  Family History  Problem Relation Age of Onset  . Heart disease Mother   . Healthy Father     Social History:  reports that he has never smoked. He has never used smokeless tobacco. He reports that he does not drink alcohol or use drugs.  No Known Allergies  Medications:  I have reviewed the patient's current medications. Prior to Admission:  Prescriptions Prior to Admission  Medication Sig Dispense Refill Last Dose  . Multiple Vitamin (MULTI-VITAMINS) TABS Take 1 tablet by mouth every morning.    09/29/2016 at Unknown time  . divalproex (DEPAKOTE ER) 500 MG 24 hr tablet Take 1 tablet (500 mg total) by mouth daily. (Patient not taking: Reported on 09/30/2016) 30 tablet 11 Not Taking at Unknown time   Scheduled: . aspirin EC  81 mg Oral  Daily  . docusate sodium  100 mg Oral BID  . heparin  5,000 Units Subcutaneous Q8H  . insulin aspart  0-9 Units Subcutaneous TID WC  . multivitamin with minerals  1 tablet Oral q morning - 10a  . pantoprazole  40 mg Oral Daily  . sodium chloride flush  3 mL Intravenous Q12H    ROS: History obtained from mother  General ROS: negative for - chills, fatigue, fever, night sweats, weight gain or weight loss Psychological ROS: negative for - behavioral disorder, hallucinations, memory difficulties, mood swings or suicidal ideation Ophthalmic ROS: negative for - blurry vision, double vision, eye pain or loss of vision ENT ROS: negative for - epistaxis, nasal discharge, oral lesions, sore throat, tinnitus or vertigo Allergy and Immunology ROS: negative for - hives or itchy/watery eyes Hematological and Lymphatic ROS: negative for - bleeding problems, bruising or swollen lymph nodes Endocrine ROS: negative for - galactorrhea, hair pattern changes, polydipsia/polyuria or temperature intolerance Respiratory ROS: negative for - cough, hemoptysis, shortness of breath or wheezing Cardiovascular ROS: negative for - chest pain, dyspnea on exertion, edema or irregular heartbeat Gastrointestinal ROS: negative for - abdominal pain, diarrhea, hematemesis, nausea/vomiting or stool incontinence Genito-Urinary ROS: negative for - dysuria, hematuria, incontinence or urinary frequency/urgency Musculoskeletal ROS: right leg numbness Neurological ROS: as noted in HPI Dermatological ROS: negative for rash and skin lesion changes  Physical Examination: Blood pressure 124/74, pulse 70, temperature 98.3 F (  36.8 C), temperature source Oral, resp. rate 18, height 5\' 10"  (1.778 m), weight 67.4 kg (148 lb 11.2 oz), SpO2 99 %.  HEENT-  Normocephalic, no lesions, without obvious abnormality.  Normal external eye and conjunctiva.  Normal TM's bilaterally.  Normal auditory canals and external ears. Normal external nose,  mucus membranes and septum.  Normal pharynx. Cardiovascular- S1, S2 normal, pulses palpable throughout   Lungs- chest clear, no wheezing, rales, normal symmetric air entry Abdomen- soft, non-tender; bowel sounds normal; no masses,  no organomegaly Extremities- no edema Lymph-no adenopathy palpable Musculoskeletal-no joint tenderness, deformity or swelling Skin-warm and dry, no hyperpigmentation, vitiligo, or suspicious lesions  Neurological Examination Mental Status: Alert, apathetic.  Does not attend to examiner.  Unable to answer questions such as what kind of work he does or what happened preceding his admission.  Speech fluent without evidence of aphasia.  Able to follow 3 step commands without difficulty. Cranial Nerves: II: Discs flat bilaterally; Visual fields grossly normal, pupils equal, round, reactive to light and accommodation III,IV, VI: ptosis not present, extra-ocular motions intact bilaterally V,VII: smile symmetric, facial light touch sensation normal bilaterally VIII: hearing normal bilaterally IX,X: gag reflex present XI: bilateral shoulder shrug XII: midline tongue extension Motor: Right : Upper extremity   5/5    Left:     Upper extremity   5/5  Lower extremity   5/5     Lower extremity   5/5 Tone and bulk:normal tone throughout; no atrophy noted Sensory: Pinprick and light touch intact throughout, bilaterally Deep Tendon Reflexes: 2+ and symmetric throughout Plantars: Right: downgoing   Left: downgoing Cerebellar: Normal finger-to-nose and normal heel-to-shin testing bilaterally Gait: normal gait and station    Laboratory Studies:   Basic Metabolic Panel:  Recent Labs Lab 09/30/16 1137 09/30/16 1329 10/01/16 0514  NA 138 140 141  K 6.9* 3.8 3.9  CL 106 105 109  CO2 24 27 24   GLUCOSE 115* 62* 104*  BUN 11 11 9   CREATININE 1.00 0.96 0.85  CALCIUM 9.5 9.6 9.1    Liver Function Tests:  Recent Labs Lab 09/30/16 1137 10/01/16 0514  AST 39 20   ALT 37 29  ALKPHOS 65 59  BILITOT 1.6* 1.2  PROT 8.3* 7.3  ALBUMIN 4.3 3.9   No results for input(s): LIPASE, AMYLASE in the last 168 hours.  Recent Labs Lab 09/30/16 1137  AMMONIA 31    CBC:  Recent Labs Lab 09/30/16 1137 10/01/16 0514  WBC 5.9 5.1  NEUTROABS 2.5  --   HGB 18.0 16.4  HCT 53.1* 47.6  MCV 94.7 93.8  PLT 243 208    Cardiac Enzymes:  Recent Labs Lab 09/30/16 1137  CKTOTAL 220  TROPONINI <0.03    BNP: Invalid input(s): POCBNP  CBG:  Recent Labs Lab 09/30/16 1415 09/30/16 1651 09/30/16 2221 10/01/16 0737 10/01/16 1204  GLUCAP 77 121* 101* 112* 110*    Microbiology: No results found for this or any previous visit.  Coagulation Studies: No results for input(s): LABPROT, INR in the last 72 hours.  Urinalysis: No results for input(s): COLORURINE, LABSPEC, PHURINE, GLUCOSEU, HGBUR, BILIRUBINUR, KETONESUR, PROTEINUR, UROBILINOGEN, NITRITE, LEUKOCYTESUR in the last 168 hours.  Invalid input(s): APPERANCEUR  Lipid Panel:  No results found for: CHOL, TRIG, HDL, CHOLHDL, VLDL, LDLCALC  HgbA1C: No results found for: HGBA1C  Urine Drug Screen:  No results found for: LABOPIA, COCAINSCRNUR, LABBENZ, AMPHETMU, THCU, LABBARB  Alcohol Level: No results for input(s): ETH in the last 168 hours.  Imaging: Dg Chest 2 View  Result Date: 09/30/2016 CLINICAL DATA:  Non smoker, confusion today EXAM: CHEST  2 VIEW COMPARISON:  None. FINDINGS: The heart size and mediastinal contours are within normal limits. Both lungs are clear. The visualized skeletal structures are unremarkable. IMPRESSION: No active cardiopulmonary disease.  No evidence of pneumonia. Electronically Signed   By: Bary Richard M.D.   On: 09/30/2016 12:24   Ct Head Wo Contrast  Result Date: 09/30/2016 CLINICAL DATA:  Poor historian.  Questionable history of seizure. EXAM: CT HEAD WITHOUT CONTRAST TECHNIQUE: Contiguous axial images were obtained from the base of the skull through the  vertex without intravenous contrast. COMPARISON:  Brain MRI - 04/30/2016 FINDINGS: Brain: Gray-white differentiation is maintained. No CT evidence of acute large territory infarct. No intraparenchymal or extra-axial mass or hemorrhage. Normal size and configuration of the ventricles and the basilar cisterns. No midline shift peer Vascular: No hyperdense vessel or unexpected calcification. Skull: Normal. Negative for fracture or focal lesion. Sinuses/Orbits: Limited visualization the paranasal sinuses and mastoid air cells is normal. No air-fluid levels. Other: Regional soft tissues appear normal. IMPRESSION: Negative noncontrast head CT. Electronically Signed   By: Simonne Come M.D.   On: 09/30/2016 12:24   Mr Laqueta Jean ZO Contrast  Result Date: 10/01/2016 CLINICAL DATA:  Acute encephalopathy. Altered mental status for a couple days. Seizure like activity. Headache. EXAM: MRI HEAD WITHOUT AND WITH CONTRAST TECHNIQUE: Multiplanar, multiecho pulse sequences of the brain and surrounding structures were obtained without and with intravenous contrast. CONTRAST:  13mL MULTIHANCE GADOBENATE DIMEGLUMINE 529 MG/ML IV SOLN COMPARISON:  Head CT 09/30/2016 and MRI 04/30/2016 FINDINGS: The study is mildly motion degraded. Brain: There is no evidence of acute infarct, intracranial hemorrhage, mass, midline shift, or extra-axial fluid collection. The ventricles and sulci are normal. The brain is normal in signal. No abnormal enhancement is identified. Vascular: Major intracranial vascular flow voids are preserved. Skull and upper cervical spine: Unremarkable bone marrow signal. Sinuses/Orbits: Unremarkable. Other: None. IMPRESSION: Unremarkable brain MRI. Electronically Signed   By: Sebastian Ache M.D.   On: 10/01/2016 12:07     Assessment/Plan: 40 year old male with altered mental status.  Blood work reasonably unremarkable, including drug screen.  MRI of the brain with and without contrast personally reviewed and  unremarkable as well.  Has had an abnormal EEG in the past.  On Depakote.  Unclear level at admission.  Patient started on Dilantin.   Can no rule out subclinical seizure activity.  If no evidence patient may require LP, although ne evidence of infection or encephalitis at this time.    Recommendations: 1.  EEG today 2.  Dilantin level in AM.  Maintenance Dilantin to be continued. 3.  Depakote level today   Thana Farr, MD Neurology [phone] 10/01/2016, 2:09 PM

## 2016-10-01 NOTE — Progress Notes (Signed)
Dr Tobi BastosPyreddy notified of py's inability to void and also pt's refusal to be cath'd or bladder scanned, verbalized understanding.

## 2016-10-01 NOTE — Progress Notes (Signed)
[facility] [place] [facility]   PATIENT NAME: Henry Conley    MR#:  [id number]  DATE OF BIRTH:  [birth year]  SUBJECTIVE:  CHIEF COMPLAINT:   Chief Complaint  Patient presents with  . Altered Mental Status     Brought with Confusion. MRI brain is negative.  REVIEW OF SYSTEMS:  CONSTITUTIONAL: No fever, fatigue or weakness.  EYES: No blurred or double vision.  EARS, NOSE, AND THROAT: No tinnitus or ear pain.  RESPIRATORY: No cough, shortness of breath, wheezing or hemoptysis.  CARDIOVASCULAR: No chest pain, orthopnea, edema.  GASTROINTESTINAL: No nausea, vomiting, diarrhea or abdominal pain.  GENITOURINARY: No dysuria, hematuria.  ENDOCRINE: No polyuria, nocturia,  HEMATOLOGY: No anemia, easy bruising or bleeding SKIN: No rash or lesion. MUSCULOSKELETAL: No joint pain or arthritis.   NEUROLOGIC: No tingling, numbness, weakness.  PSYCHIATRY: No anxiety or depression.   ROS  DRUG ALLERGIES:  No Known Allergies  VITALS:  Blood pressure 124/74, pulse 70, temperature 98.3 F (36.8 C), temperature source Oral, resp. rate 18, height 5\' 10"  (1.778 m), weight 67.4 kg (148 lb 11.2 oz), SpO2 99 %.  PHYSICAL EXAMINATION:  GENERAL:  40 y.o.-year-old patient lying in the bed with no acute distress.  EYES: Pupils equal, round, reactive to light and accommodation. No scleral icterus. Extraocular muscles intact.  HEENT: Head atraumatic, normocephalic. Oropharynx and nasopharynx clear.  NECK:  Supple, no jugular venous distention. No thyroid enlargement, no tenderness.  LUNGS: Normal breath sounds bilaterally, no wheezing, rales,rhonchi or crepitation. No use of accessory muscles of respiration.  CARDIOVASCULAR: S1, S2 normal. No murmurs, rubs, or gallops.  ABDOMEN: Soft, nontender, nondistended. Bowel sounds present. No organomegaly or mass.  EXTREMITIES: No pedal edema, cyanosis, or clubbing.  NEUROLOGIC: Cranial nerves II through XII are intact. Muscle strength  5/5 in all extremities. Sensation intact. Gait not checked.  PSYCHIATRIC: The patient is alert and oriented x 3.  SKIN: No obvious rash, lesion, or ulcer.   Physical Exam LABORATORY PANEL:   CBC  Recent Labs Lab 10/01/16 0514  WBC 5.1  HGB 16.4  HCT 47.6  PLT 208   ------------------------------------------------------------------------------------------------------------------  Chemistries   Recent Labs Lab 10/01/16 0514  NA 141  K 3.9  CL 109  CO2 24  GLUCOSE 104*  BUN 9  CREATININE 0.85  CALCIUM 9.1  AST 20  ALT 29  ALKPHOS 59  BILITOT 1.2   ------------------------------------------------------------------------------------------------------------------  Cardiac Enzymes  Recent Labs Lab 09/30/16 1137  TROPONINI <0.03   ------------------------------------------------------------------------------------------------------------------  RADIOLOGY:  Dg Chest 2 View  Result Date: 09/30/2016 CLINICAL DATA:  Non smoker, confusion today EXAM: CHEST  2 VIEW COMPARISON:  None. FINDINGS: The heart size and mediastinal contours are within normal limits. Both lungs are clear. The visualized skeletal structures are unremarkable. IMPRESSION: No active cardiopulmonary disease.  No evidence of pneumonia. Electronically Signed   By: Bary Richard M.D.   On: 09/30/2016 12:24   Ct Head Wo Contrast  Result Date: 09/30/2016 CLINICAL DATA:  Poor historian.  Questionable history of seizure. EXAM: CT HEAD WITHOUT CONTRAST TECHNIQUE: Contiguous axial images were obtained from the base of the skull through the vertex without intravenous contrast. COMPARISON:  Brain MRI - 04/30/2016 FINDINGS: Brain: Gray-white differentiation is maintained. No CT evidence of acute large territory infarct. No intraparenchymal or extra-axial mass or hemorrhage. Normal size and configuration of the ventricles and the basilar cisterns. No midline shift peer Vascular: No hyperdense vessel or unexpected  calcification. Skull: Normal. Negative for fracture or  focal lesion. Sinuses/Orbits: Limited visualization the paranasal sinuses and mastoid air cells is normal. No air-fluid levels. Other: Regional soft tissues appear normal. IMPRESSION: Negative noncontrast head CT. Electronically Signed   By: Simonne ComeJohn  Watts M.D.   On: 09/30/2016 12:24   Mr Laqueta JeanBrain W ZOWo Contrast  Result Date: 10/01/2016 CLINICAL DATA:  Acute encephalopathy. Altered mental status for a couple days. Seizure like activity. Headache. EXAM: MRI HEAD WITHOUT AND WITH CONTRAST TECHNIQUE: Multiplanar, multiecho pulse sequences of the brain and surrounding structures were obtained without and with intravenous contrast. CONTRAST:  13mL MULTIHANCE GADOBENATE DIMEGLUMINE 529 MG/ML IV SOLN COMPARISON:  Head CT 09/30/2016 and MRI 04/30/2016 FINDINGS: The study is mildly motion degraded. Brain: There is no evidence of acute infarct, intracranial hemorrhage, mass, midline shift, or extra-axial fluid collection. The ventricles and sulci are normal. The brain is normal in signal. No abnormal enhancement is identified. Vascular: Major intracranial vascular flow voids are preserved. Skull and upper cervical spine: Unremarkable bone marrow signal. Sinuses/Orbits: Unremarkable. Other: None. IMPRESSION: Unremarkable brain MRI. Electronically Signed   By: Sebastian AcheAllen  Grady M.D.   On: 10/01/2016 12:07    ASSESSMENT AND PLAN:   Principal Problem:   Encephalopathy acute Active Problems:   Hyperkalemia   Seizure disorder (HCC)   Abnormal glucose  * Acute encephalopathy   Possible seizures- absence seizures.   Neurologist ordered MRI, EEG and dilantin.   If not much answers then she may consider LP.    To check Dilantin level tomorrow.  * hyperkalemia   Resolved with IV fluids now   All the records are reviewed and case discussed with Care Management/Social Workerr. Management plans discussed with the patient, family and they are in agreement.  CODE STATUS:  Full.  TOTAL TIME TAKING CARE OF THIS PATIENT: 35 minutes.   Discussed with his mother in room today.  POSSIBLE D/C IN 1-2 DAYS, DEPENDING ON CLINICAL CONDITION.   Altamese DillingVACHHANI, Joaovictor Krone M.D on 10/01/2016   Between 7am to 6pm - Pager - [phone]  After 6pm go to [email or web address] - password [email or web address]  [facility]  Office  [phone]  CC: Primary care physician; Pasty Spillersracy N McLean-Scocuzza, MD  Note: This dictation was prepared with Dragon dictation along with smaller phrase technology. Any transcriptional errors that result from this process are unintentional.

## 2016-10-01 NOTE — Progress Notes (Signed)
Per Dr. Esaw GrandchildVachanni okay to place order for 1mg  dose of ativan IV to be adminsitered due to patient feeling agitated in MRI

## 2016-10-02 DIAGNOSIS — G40909 Epilepsy, unspecified, not intractable, without status epilepticus: Principal | ICD-10-CM

## 2016-10-02 DIAGNOSIS — G9349 Other encephalopathy: Secondary | ICD-10-CM

## 2016-10-02 LAB — HEMOGLOBIN A1C
Hgb A1c MFr Bld: 5.4 % (ref 4.8–5.6)
Mean Plasma Glucose: 108 mg/dL

## 2016-10-02 LAB — GLUCOSE, CAPILLARY
Glucose-Capillary: 101 mg/dL — ABNORMAL HIGH (ref 65–99)
Glucose-Capillary: 113 mg/dL — ABNORMAL HIGH (ref 65–99)

## 2016-10-02 LAB — VALPROIC ACID LEVEL: Valproic Acid Lvl: <10 ug/mL — ABNORMAL LOW (ref 50.0–100.0)

## 2016-10-02 LAB — HEAVY METALS, BLOOD
Arsenic: 5 ug/L (ref 2–23)
LEAD: NOT DETECTED ug/dL (ref 0–19)
Mercury: NOT DETECTED ug/L (ref 0.0–14.9)

## 2016-10-02 LAB — RPR: RPR: NONREACTIVE

## 2016-10-02 LAB — THYROID PANEL WITH TSH
FREE THYROXINE INDEX: 1.6 (ref 1.2–4.9)
T3 UPTAKE RATIO: 26 % (ref 24–39)
T4 TOTAL: 6 ug/dL (ref 4.5–12.0)
TSH: 1.71 u[IU]/mL (ref 0.450–4.500)

## 2016-10-02 LAB — PHENYTOIN LEVEL, TOTAL: PHENYTOIN LVL: 11.3 ug/mL (ref 10.0–20.0)

## 2016-10-02 MED ORDER — PHENYTOIN SODIUM EXTENDED 300 MG PO CAPS: 300.0000 mg | ORAL_CAPSULE | Freq: Every day | ORAL | 1 refills | Status: DC

## 2016-10-02 NOTE — Progress Notes (Signed)
Patient discharged to home as ordered.Patient instructed to follow up with Dr. [name] as ordered and patient not released to go back to work until cleared by neurologist.Patient alert and oriented times 4. Denies pain at this time. No acute distress noted.

## 2016-10-02 NOTE — Progress Notes (Addendum)
Subjective: Patient much improved today.  More appropriate.   Objective: Current vital signs: BP 129/89 (BP Location: Left Arm)   Pulse 72   Temp 98.3 F (36.8 C) (Oral)   Resp 18   Ht 5\' 10"  (1.778 m)   Wt 67.2 kg (148 lb 3.2 oz)   SpO2 100%   BMI 21.26 kg/m  Vital signs in last 24 hours: Temp:  [98.2 F (36.8 C)-98.3 F (36.8 C)] 98.3 F (36.8 C) (10/03 0430) Pulse Rate:  [63-72] 72 (10/03 0430) Resp:  [16-18] 18 (10/03 0430) BP: (124-129)/(74-89) 129/89 (10/03 0430) SpO2:  [99 %-100 %] 100 % (10/03 0430) Weight:  [67.2 kg (148 lb 3.2 oz)] 67.2 kg (148 lb 3.2 oz) (10/03 0430)  Intake/Output from previous day: 10/02 0701 - 10/03 0700 In: 2371.4 [I.V.:2371.4] Out: 850 [Urine:850] Intake/Output this shift: Total I/O In: 0  Out: 250 [Urine:250] Nutritional status: Diet regular Room service appropriate? Yes; Fluid consistency: Thin  Neurologic Exam: Mental Status: Alert, oriented, thought content appropriate.  Speech fluent without evidence of aphasia.  Able to follow 3 step commands without difficulty. Cranial Nerves: II: Discs flat bilaterally; Visual fields grossly normal, pupils equal, round, reactive to light and accommodation III,IV, VI: ptosis not present, extra-ocular motions intact bilaterally V,VII: smile symmetric, facial light touch sensation normal bilaterally VIII: hearing normal bilaterally IX,X: gag reflex present XI: bilateral shoulder shrug XII: midline tongue extension Motor: Right : Upper extremity   5/5    Left:     Upper extremity   5/5  Lower extremity   5/5     Lower extremity   5/5 Tone and bulk:normal tone throughout; no atrophy noted Sensory: Pinprick and light touch intact throughout, bilaterally Deep Tendon Reflexes: 2+ and symmetric throughout   Lab Results: Basic Metabolic Panel:  Recent Labs Lab 09/30/16 1137 09/30/16 1329 10/01/16 0514  NA 138 140 141  K 6.9* 3.8 3.9  CL 106 105 109  CO2 24 27 24   GLUCOSE 115* 62* 104*   BUN 11 11 9   CREATININE 1.00 0.96 0.85  CALCIUM 9.5 9.6 9.1    Liver Function Tests:  Recent Labs Lab 09/30/16 1137 10/01/16 0514  AST 39 20  ALT 37 29  ALKPHOS 65 59  BILITOT 1.6* 1.2  PROT 8.3* 7.3  ALBUMIN 4.3 3.9   No results for input(s): LIPASE, AMYLASE in the last 168 hours.  Recent Labs Lab 09/30/16 1137  AMMONIA 31    CBC:  Recent Labs Lab 09/30/16 1137 10/01/16 0514  WBC 5.9 5.1  NEUTROABS 2.5  --   HGB 18.0 16.4  HCT 53.1* 47.6  MCV 94.7 93.8  PLT 243 208    Cardiac Enzymes:  Recent Labs Lab 09/30/16 1137  CKTOTAL 220  TROPONINI <0.03    Lipid Panel: No results for input(s): CHOL, TRIG, HDL, CHOLHDL, VLDL, LDLCALC in the last 168 hours.  CBG:  Recent Labs Lab 10/01/16 0737 10/01/16 1204 10/01/16 1659 10/01/16 2135 10/02/16 0729  GLUCAP 112* 110* 108* 111* 101*    Microbiology: No results found for this or any previous visit.  Coagulation Studies: No results for input(s): LABPROT, INR in the last 72 hours.  Imaging: Dg Chest 2 View  Result Date: 09/30/2016 CLINICAL DATA:  Non smoker, confusion today EXAM: CHEST  2 VIEW COMPARISON:  None. FINDINGS: The heart size and mediastinal contours are within normal limits. Both lungs are clear. The visualized skeletal structures are unremarkable. IMPRESSION: No active cardiopulmonary disease.  No evidence of pneumonia.  Electronically Signed   By: Bary Richard M.D.   On: 09/30/2016 12:24   Ct Head Wo Contrast  Result Date: 09/30/2016 CLINICAL DATA:  Poor historian.  Questionable history of seizure. EXAM: CT HEAD WITHOUT CONTRAST TECHNIQUE: Contiguous axial images were obtained from the base of the skull through the vertex without intravenous contrast. COMPARISON:  Brain MRI - 04/30/2016 FINDINGS: Brain: Gray-white differentiation is maintained. No CT evidence of acute large territory infarct. No intraparenchymal or extra-axial mass or hemorrhage. Normal size and configuration of the  ventricles and the basilar cisterns. No midline shift peer Vascular: No hyperdense vessel or unexpected calcification. Skull: Normal. Negative for fracture or focal lesion. Sinuses/Orbits: Limited visualization the paranasal sinuses and mastoid air cells is normal. No air-fluid levels. Other: Regional soft tissues appear normal. IMPRESSION: Negative noncontrast head CT. Electronically Signed   By: Simonne Come M.D.   On: 09/30/2016 12:24   Mr Laqueta Jean ZO Contrast  Result Date: 10/01/2016 CLINICAL DATA:  Acute encephalopathy. Altered mental status for a couple days. Seizure like activity. Headache. EXAM: MRI HEAD WITHOUT AND WITH CONTRAST TECHNIQUE: Multiplanar, multiecho pulse sequences of the brain and surrounding structures were obtained without and with intravenous contrast. CONTRAST:  13mL MULTIHANCE GADOBENATE DIMEGLUMINE 529 MG/ML IV SOLN COMPARISON:  Head CT 09/30/2016 and MRI 04/30/2016 FINDINGS: The study is mildly motion degraded. Brain: There is no evidence of acute infarct, intracranial hemorrhage, mass, midline shift, or extra-axial fluid collection. The ventricles and sulci are normal. The brain is normal in signal. No abnormal enhancement is identified. Vascular: Major intracranial vascular flow voids are preserved. Skull and upper cervical spine: Unremarkable bone marrow signal. Sinuses/Orbits: Unremarkable. Other: None. IMPRESSION: Unremarkable brain MRI. Electronically Signed   By: Sebastian Ache M.D.   On: 10/01/2016 12:07    Medications:  I have reviewed the patient's current medications. Scheduled: . aspirin EC  81 mg Oral Daily  . docusate sodium  100 mg Oral BID  . enoxaparin (LOVENOX) injection  40 mg Subcutaneous Q24H  . insulin aspart  0-9 Units Subcutaneous TID WC  . multivitamin with minerals  1 tablet Oral q morning - 10a  . pantoprazole  40 mg Oral Daily  . phenytoin  300 mg Oral QHS  . sodium chloride flush  3 mL Intravenous Q12H    Assessment/Plan: Cognitively patient  much improved today.  Appropriate.  Follows commands.  EEG abnormal with suggesting epileptiform activity and bifrontal slowing.  Tolerating Dilantin.  Level 11.3.  Recommendations: 1. Continue Dilantin at current dose of 300mg  qhs 2. Patient requesting discharge. To follow up with Dr. Terrace Arabia by the end of the week. 3. Patient unable to drive, operate heavy machinery, perform activities at heights and participate in water activities until release by outpatient physician.  Patient unable to return to work until released by outpatient physician.    LOS: 2 days   Thana Farr, MD Neurology [phone] 10/02/2016  10:17 AM

## 2016-10-02 NOTE — Discharge Instructions (Signed)
No driving or operating heavy machinary until you see a neurologist in office.

## 2016-10-03 ENCOUNTER — Telehealth: Payer: Self-pay | Admitting: *Deleted

## 2016-10-03 NOTE — Telephone Encounter (Signed)
Labs received from [facility] (collected on 09/20/16):  CBC w/ DIFF: RBC: 5.26 HGB: 17.1 Hematocrit: 48.5  CMP:  BUN: 9 Creatinine: 0.98  LIPID PANEL: Cholesterol: 254 LDL Cholesterol: 155  VITAMIN B, SERUM: 807  HIV 1/2 Ag/Ab, 4TH Generation:  Nonreactive  SED RATE: 1  TSH: 0.89  HEP B Surface Antigen: Negative  HEP C Antibody: Negative  Hepatitis B Surface Antibody, Quant: <5.0  Rheumatoid Factor: <14    ANA: Positive ANA Pattern: Neucleolar ANA Titer: 1:40  LYME Ab Screen: <0.90  RPR w/ Rflx to Titer and Confirm:  RPR: nonreactive  Protein, Total and Electrophoresis: normal

## 2016-10-05 ENCOUNTER — Other Ambulatory Visit: Payer: Self-pay | Admitting: *Deleted

## 2016-10-05 ENCOUNTER — Encounter: Payer: Self-pay | Admitting: *Deleted

## 2016-10-05 NOTE — Patient Outreach (Addendum)
[facility] Care Management  10/05/2016  Henry Conley 12-Jan-1976 [id number]   Subjective: Telephone call to patient's home number, spoke with patient, and HIPAA verified.  Discussed [facility] Care [facility] Transition of care follow up and patient in agreement to complete follow up. Patient states he is doing well and exercising everyday by walking down the street.  Has a follow up appointment with Dr. [name] on 10/17/16 and Dr. [name] on 11/05/16.  States he is aware that both MDs are neurologist and planning to follow up with both MDs.   Has seen Dr. [name] in the past for follow up.   [name] educated patient on the importance of hospital follow up with primary MD, voices understanding, and states he will follow up to schedule an appointment.  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 States he is not sure when he is suppose to return to work and ask [name] if she knew.  [name] advised patient per MD progress notes, hospital discharging RN that he was not suppose to return to work until outpatient neurologist appointment and neurologist would determine when he could return to work.  Patient voices understanding, states he will also follow up with Dr. [name] regarding return to work date, and completion of family medical leave act paperwork.  Patient states he has been in communication with work [name], retailsupervisor and is schedule to return to work on Monday 10/08/16.   States he will call supervisor to advise that he has a follow up appointment on 10/17/16 and will not be able to return to work until cleared by MD. Patient states he has not initiated family medical leave act paperwork and is unsure of the process.   [name] educated patient on family medical leave act (FMLA) paperwork and the relationship of PAL time with leave.  Patient provided with phone number for [employer] [phone]) and states he will follow up, asked for paperwork to be mailed to him,  and also faxed to Dr. [name] for [facility].  Patient states he will  follow up with Dr. [name] to advise FMLA paperwork will be faxed to him for completion.   Patient educated on Advanced Directives,  free document completion service for [name] Employees, given phone number for [facility] Spiritual Care department [phone]), in agreement to receive Advanced Directives packet, and will follow up to make an appointment if he feels appropriate.  Patient educated on supplemental insurance and given phone number for [name] benefit specialist [phone]).    States he will call to verify his purchased insurance and file claim for [name], if appropriate.  Patient [name].  States he does not have a car and has transportation as needed.  Patient states he does not have any transition of care, care coordination, disease management, disease monitoring, transportation, community resource, or pharmacy needs at this time.  States he is very appreciative of the follow up call and will contact [name] if he has any needs in the future.   Telephone call to Dr. [name] (consulting MD while patient in the hospital), no answer, and [name] left contact phone number on pager for return call.  Telephone call from Dr. [name] , [facility] advised discharge instruction did not have information regarding return to work .   She verified that patient can not return to until after neurologist appointment and outpatient neurologist would determine return to work date.  States patient's mother and patient were advised of patient's return to work criteria prior to discharge and feels patient may want to return to work sooner than recommended.  Dr. Thad Ranger states she is very appreciative of [name]'s call and continues to support return to work criteria per her 10/02/16 progress note.   Objective: Per chart review: Patient hospitalized  09/30/16 - 10/02/16 for acute encephalopathy.   Patient also  has a history of seizure disorders.    Assessment:  Received referral from [facility] Admission report dated 10/13/16.    Telephone screen / transition of care follow up completed, no care management needs, and will proceed with case closure.  Plan: [name] will send patient successful outreach letter, [facility] Care Management pamphlet,  24 hour nurse line magnet, and Advanced Directives packet. [name] will send case closure due to follow up completed / no care management needs request to [name] at [facility] Care Management.    Henry Conley, BSN, CCM [facility] Care Management [facility] Telephonic CM Phone: [phone] Fax: [phone]

## 2016-10-07 NOTE — Discharge Summary (Signed)
[facility] [place] [facility]   PATIENT NAME: Henry Conley    MR#:  [id number]  DATE OF BIRTH:  Jan 09, 1976  DATE OF ADMISSION:  09/30/2016 ADMITTING PHYSICIAN: Marguarite Arbour, MD  DATE OF DISCHARGE: 10/02/2016  3:25 PM  PRIMARY CARE PHYSICIAN: Pasty Spillers McLean-Scocuzza, MD    ADMISSION DIAGNOSIS:  Encephalopathy acute [G93.40]  DISCHARGE DIAGNOSIS:  Principal Problem:   Encephalopathy acute Active Problems:   Hyperkalemia   Seizure disorder (HCC)   Abnormal glucose   SECONDARY DIAGNOSIS:   Past Medical History:  Diagnosis Date  . Left leg numbness   . Seizures [facility])     HOSPITAL COURSE:   Principal Problem:   Encephalopathy acute Active Problems:   Hyperkalemia   Seizure disorder (HCC)   Abnormal glucose  * Acute encephalopathy   Possible seizures- absence seizures.   Neurologist ordered MRI, EEG and dilantin.   If not much answers then she may consider LP.    next day much improved and was back to abseline- so suggested to d/c with anti seizure meds.  * hyperkalemia   Resolved with IV fluids now  DISCHARGE CONDITIONS:   Stable.  CONSULTS OBTAINED:  Treatment Team:  Thana Farr, MD  DRUG ALLERGIES:  No Known Allergies  DISCHARGE MEDICATIONS:   Discharge Medication List as of 10/02/2016  2:40 PM    START taking these medications   Details  phenytoin (DILANTIN) 300 MG ER capsule Take 1 capsule (300 mg total) by mouth at bedtime., Starting Tue 10/02/2016, Print      CONTINUE these medications which have NOT CHANGED   Details  Multiple Vitamin (MULTI-VITAMINS) TABS Take 1 tablet by mouth every morning. , Historical Med      STOP taking these medications     divalproex (DEPAKOTE ER) 500 MG 24 hr tablet          DISCHARGE INSTRUCTIONS:    Follow with neurologist in 2 weeks.  If you experience worsening of your admission symptoms, develop shortness of breath, life threatening emergency, suicidal or  homicidal thoughts you must seek medical attention immediately by calling 911 or calling your MD immediately  if symptoms less severe.  You Must read complete instructions/literature along with all the possible adverse reactions/side effects for all the Medicines you take and that have been prescribed to you. Take any new Medicines after you have completely understood and accept all the possible adverse reactions/side effects.   Please note  You were cared for by a hospitalist during your hospital stay. If you have any questions about your discharge medications or the care you received while you were in the hospital after you are discharged, you can call the unit and asked to speak with the hospitalist on call if the hospitalist that took care of you is not available. Once you are discharged, your primary care physician will handle any further medical issues. Please note that NO REFILLS for any discharge medications will be authorized once you are discharged, as it is imperative that you return to your primary care physician (or establish a relationship with a primary care physician if you do not have one) for your aftercare needs so that they can reassess your need for medications and monitor your lab values.    Today   CHIEF COMPLAINT:   Chief Complaint  Patient presents with  . Altered Mental Status    HISTORY OF PRESENT ILLNESS:  Charle Conley  is a 40 y.o. male with a known  history of questionable seizures and LLE numbness who is brought to the ER by his mother for 1-2 day hx of altered mental status. The patient is awake and alert but unable to follow commands. He does answer some questions appropriately. Mother states she has noticed some seizure-like activity and the pt did c/o headache yesterday. In the ER, head CT was OK. K+ was elevated at 6.9. Ammonia also mildly elevated. He is now admitted.  VITAL SIGNS:  Blood pressure 117/72, pulse 72, temperature 98.6 F (37 C),  temperature source Oral, resp. rate 16, height 5\' 10"  (1.778 m), weight 67.2 kg (148 lb 3.2 oz), SpO2 100 %.  I/O:  No intake or output data in the 24 hours ending 10/07/16 1517  PHYSICAL EXAMINATION:  GENERAL:  40 y.o.-year-old patient lying in the bed with no acute distress.  EYES: Pupils equal, round, reactive to light and accommodation. No scleral icterus. Extraocular muscles intact.  HEENT: Head atraumatic, normocephalic. Oropharynx and nasopharynx Conley.  NECK:  Supple, no jugular venous distention. No thyroid enlargement, no tenderness.  LUNGS: Normal breath sounds bilaterally, no wheezing, rales,rhonchi or crepitation. No use of accessory muscles of respiration.  CARDIOVASCULAR: S1, S2 normal. No murmurs, rubs, or gallops.  ABDOMEN: Soft, non-tender, non-distended. Bowel sounds present. No organomegaly or mass.  EXTREMITIES: No pedal edema, cyanosis, or clubbing.  NEUROLOGIC: Cranial nerves II through XII are intact. Muscle strength 5/5 in all extremities. Sensation intact. Gait not checked.  PSYCHIATRIC: The patient is alert and oriented x 3.  SKIN: No obvious rash, lesion, or ulcer.   DATA REVIEW:   CBC  Recent Labs Lab 10/01/16 0514  WBC 5.1  HGB 16.4  HCT 47.6  PLT 208    Chemistries   Recent Labs Lab 10/01/16 0514  NA 141  K 3.9  CL 109  CO2 24  GLUCOSE 104*  BUN 9  CREATININE 0.85  CALCIUM 9.1  AST 20  ALT 29  ALKPHOS 59  BILITOT 1.2    Cardiac Enzymes No results for input(s): TROPONINI in the last 168 hours.  Microbiology Results  No results found for this or any previous visit.  RADIOLOGY:  No results found.  EKG:   Orders placed or performed during the hospital encounter of 09/30/16  . ED EKG  . ED EKG      Management plans discussed with the patient, family and they are in agreement.  CODE STATUS:  Code Status History    Date Active Date Inactive Code Status Order ID Comments User Context   09/30/2016  4:32 PM 10/02/2016  6:26  PM Full Code [id number]  Marguarite ArbourJeffrey D Sparks, MD Inpatient      TOTAL TIME TAKING CARE OF THIS PATIENT: 35 minutes.    Altamese DillingVACHHANI, Logan Vegh M.D on 10/07/2016 at 3:17 PM  Between 7am to 6pm - Pager - [phone]  After 6pm go to [email or web address] - password [email or web address]  [place] [facility]  Office  [phone]  CC: Primary care physician; Pasty Spillersracy N McLean-Scocuzza, MD   Note: This dictation was prepared with Dragon dictation along with smaller phrase technology. Any transcriptional errors that result from this process are unintentional.

## 2016-11-15 ENCOUNTER — Ambulatory Visit: Payer: 59 | Admitting: Neurology

## 2016-12-17 DIAGNOSIS — E785 Hyperlipidemia, unspecified: Secondary | ICD-10-CM | POA: Diagnosis not present

## 2016-12-26 DIAGNOSIS — R202 Paresthesia of skin: Secondary | ICD-10-CM | POA: Diagnosis not present

## 2016-12-26 DIAGNOSIS — M5126 Other intervertebral disc displacement, lumbar region: Secondary | ICD-10-CM | POA: Diagnosis not present

## 2017-01-04 ENCOUNTER — Telehealth: Payer: Self-pay | Admitting: Neurology

## 2017-01-04 NOTE — Telephone Encounter (Signed)
I reviewed [facility] lumbar [facility], mild disc bulging at L4-5, L5-S1 without significant spinal canal narrowing, otherwise nonremarkable MRI lumbar

## 2017-01-17 ENCOUNTER — Ambulatory Visit: Payer: 59 | Admitting: Physical Therapy

## 2017-01-21 ENCOUNTER — Ambulatory Visit: Payer: 59 | Attending: Internal Medicine | Admitting: Physical Therapy

## 2017-01-21 ENCOUNTER — Encounter: Payer: 59 | Admitting: Physical Therapy

## 2017-01-24 ENCOUNTER — Encounter: Payer: 59 | Admitting: Physical Therapy

## 2017-01-28 ENCOUNTER — Encounter: Payer: 59 | Admitting: Physical Therapy

## 2017-02-27 ENCOUNTER — Ambulatory Visit: Payer: 59 | Attending: Internal Medicine | Admitting: Physical Therapy

## 2017-02-27 DIAGNOSIS — M5442 Lumbago with sciatica, left side: Secondary | ICD-10-CM | POA: Insufficient documentation

## 2017-02-27 DIAGNOSIS — G8929 Other chronic pain: Secondary | ICD-10-CM | POA: Insufficient documentation

## 2017-02-27 DIAGNOSIS — R262 Difficulty in walking, not elsewhere classified: Secondary | ICD-10-CM | POA: Diagnosis not present
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
## 2017-03-04 ENCOUNTER — Encounter: Payer: 59 | Admitting: Physical Therapy

## 2017-03-04 NOTE — Addendum Note (Signed)
Addended by: Dorette GrateFISHER, Marbin Olshefski C on: 03/04/2017 09:56 AM   Modules accepted: Orders

## 2017-03-07 ENCOUNTER — Encounter: Payer: 59 | Admitting: Physical Therapy

## 2017-03-07 ENCOUNTER — Ambulatory Visit: Payer: 59 | Attending: Internal Medicine | Admitting: Physical Therapy

## 2017-03-07 DIAGNOSIS — R262 Difficulty in walking, not elsewhere classified: Secondary | ICD-10-CM | POA: Insufficient documentation

## 2017-03-07 DIAGNOSIS — G8929 Other chronic pain: Secondary | ICD-10-CM | POA: Insufficient documentation

## 2017-03-07 DIAGNOSIS — M5442 Lumbago with sciatica, left side: Secondary | ICD-10-CM | POA: Insufficient documentation

## 2017-03-07 NOTE — Therapy (Signed)
[facility] [facility] [address], [place], [address] Phone: [phone]   Fax:  [phone]  Physical Therapy Treatment  Patient Details  Name: Henry Conley MRN: [id number] Date of Birth: 1976-02-01 Referring Provider: Annice Needy  Encounter Date: 03/07/2017      PT End of Session - 03/07/17 1110    Visit Number 2   Number of Visits 8   Date for PT Re-Evaluation 04/24/17   PT Start Time 1100   PT Stop Time 1130   PT Time Calculation (min) 30 min      Past Medical History:  Diagnosis Date  . Left leg numbness   . Seizures (HCC)     No past surgical history on file.  There were no vitals filed for this visit.      Subjective Assessment - 03/07/17 1108    Subjective Pt reports over the past since his last session he has had a daily instance of a near fall when he was able to catch himself.   Pertinent History Pt reports he is stressed out and depressed, he is unable to drive due to potential for this to be a seizure. Reports falls nearly daily.   How long can you walk comfortably? pt walks daily   Diagnostic tests negative MRI for brain, cervical spine, cervical disc bulge at C4-C5   Patient Stated Goals Pt wants to figure out what is going on, to decr. symptoms.             Objective: Prone extension on pillows x5 min with manual overpressure, during this time educated pt on directional preference treatment strategy.  Standing extensions using mob belt 3x10 reps, educated on this as HEP.  palloff press with GTB 3x10 each side, cuing for TA contraction.  Pt expressed frustration throughout session that he does not understand what is going on with his legs.                         PT Long Term Goals - 02/27/17 1140      PT LONG TERM GOAL #1   Title Pt will report no falls in a 2 wk period of time   Baseline Pt reports regular falls   Time 4   Period Weeks   Status New     PT LONG TERM GOAL #2   Title Pt will be I with HEP to improve LE strength as seen by decr. report of inconsistent weakness.   Baseline Pt reports daily weakness which is inconsistent.   Time 4   Period Weeks   Status New               Plan - 03/07/17 1129    Clinical Impression Statement Issued and had pt perform directional preference treatment due to possibility of nerve compression/entrpment. Pt tolerated treatment well, however as he has no pain difficult to assess efficacy of treatment.   Rehab Potential Fair   Clinical Impairments Affecting Rehab Potential motivation, age/difficulty finding deficits to treat   PT Frequency 2x / week   PT Duration 4 weeks   PT Treatment/Interventions ADLs/Self Care Home Management;Dry needling;Vestibular;Manual techniques;Neuromuscular re-education;Stair training;Functional mobility training;Balance training;Therapeutic exercise;Therapeutic activities   PT Next Visit Plan strengthen LE, work on balance   Consulted and Agree with Plan of Care Patient      Patient will benefit from skilled therapeutic intervention in order to improve the following deficits and impairments:  Decreased balance, Difficulty walking, Decreased coordination  Visit Diagnosis: Difficulty in walking, not elsewhere classified     Problem List Patient Active Problem List   Diagnosis Date Noted  . Encephalopathy acute 09/30/2016  . Hyperkalemia 09/30/2016  . Seizure disorder (HCC) 09/30/2016  . Abnormal glucose 09/30/2016    Fisher,Benjamin PT DPT 03/07/2017, 11:31 AM  [facility] [address] [place], [place], [address] Phone: [phone]   Fax:  [phone]  Name: Henry Conley MRN: [id number] Date of Birth: [birth year]

## 2017-03-11 ENCOUNTER — Ambulatory Visit: Payer: 59 | Admitting: Physical Therapy

## 2017-03-11 ENCOUNTER — Encounter: Payer: 59 | Admitting: Physical Therapy

## 2017-03-14 ENCOUNTER — Ambulatory Visit: Payer: 59 | Admitting: Physical Therapy

## 2017-03-14 ENCOUNTER — Encounter: Payer: Self-pay | Admitting: Physical Therapy

## 2017-03-14 DIAGNOSIS — G8929 Other chronic pain: Secondary | ICD-10-CM | POA: Diagnosis not present

## 2017-03-14 DIAGNOSIS — R262 Difficulty in walking, not elsewhere classified: Secondary | ICD-10-CM | POA: Diagnosis not present

## 2017-03-14 DIAGNOSIS — M5442 Lumbago with sciatica, left side: Secondary | ICD-10-CM

## 2017-03-14 NOTE — Therapy (Signed)
[place] [facility] [address], [place], [address] Phone: [phone]   Fax:  [phone]  Physical Therapy Treatment  Patient Details  Name: Henry Conley MRN: [id number] Date of Birth: [birth year] Referring Provider: Annice Needy  Encounter Date: 03/14/2017      PT End of Session - 03/14/17 1025    Visit Number 3   Number of Visits 8   Date for PT Re-Evaluation 04/24/17   PT Start Time 1024   PT Stop Time 1057   PT Time Calculation (min) 33 min   Activity Tolerance Patient tolerated treatment well   Behavior During Therapy [facility] for tasks assessed/performed      Past Medical History:  Diagnosis Date  . Left leg numbness   . Seizures (HCC)     History reviewed. No pertinent surgical history.  There were no vitals filed for this visit.      Subjective Assessment - 03/14/17 1026    Subjective Pt arrived 10 minutes late to appointment, limiting session.  Pt reports he continues to experience episodes of LLE numbness with his "locking up" and inability to move during theses episodes.  Did not have this occur yesterday. He denies doing a specific activity when this occurs. Has been completing his HEP.  No new complaints or concerns.   Pertinent History Pt reports he is stressed out and depressed, he is unable to drive due to potential for this to be a seizure. Reports falls nearly daily.   How long can you walk comfortably? pt walks daily   Diagnostic tests negative MRI for brain, cervical spine, cervical disc bulge at C4-C5   Patient Stated Goals Pt wants to figure out what is going on, to decr. symptoms.   Currently in Pain? No/denies       TREATMENT   Therapeutic Exercise:  Standing repeated extensions x20   Repeated prone extensions 2x15   Prone extension on pillows x5 min with manual overpressure.   Hooklying TA contraction 10x10 seconds with verbal and tactile cues for proper technique    Plank with arms extended 3x30 seconds, noted fatigue toward end of each set   Side plank on elbow 2x20 each side, more challenging on L with cues needed to elevate pelvis   Paloff press at [facility] tower 15# 1x10, 20# 2x10 with cues for TA activation              PT Education - 03/14/17 1025    Education provided Yes   Education Details Exercise technique; role of therapeutic exercises   Person(s) Educated Patient   Methods Explanation;Demonstration   Comprehension Verbalized understanding;Returned demonstration;Need further instruction             PT Long Term Goals - 02/27/17 1140      PT LONG TERM GOAL #1   Title Pt will report no falls in a 2 wk period of time   Baseline Pt reports regular falls   Time 4   Period Weeks   Status New     PT LONG TERM GOAL #2   Title Pt will be I with HEP to improve LE strength as seen by decr. report of inconsistent weakness.   Baseline Pt reports daily weakness which is inconsistent.   Time 4   Period Weeks   Status New               Plan - 03/14/17 1058    Clinical Impression Statement Pt demonstrated  fatigue and poor endurance with plank exercises and will benefit from further core strengthening with static and dynamic activities.  Pt instructed to be mindful of motions at work or home that would aggravate his back (i.e. bending and twisting simultaneously).  He tolerated all interventions well and was instructed to continue HEP as prescribed and to record his episodes so the frequency can be tracked for efficacy of current treatment.  Pt will benefit from continued skilled PT interventions for decreased frequency of episodes.   Rehab Potential Fair   Clinical Impairments Affecting Rehab Potential motivation, age/difficulty finding deficits to treat   PT Frequency 2x / week   PT Duration 4 weeks   PT Treatment/Interventions ADLs/Self Care Home Management;Dry needling;Vestibular;Manual techniques;Neuromuscular  re-education;Stair training;Functional mobility training;Balance training;Therapeutic exercise;Therapeutic activities   PT Next Visit Plan strengthen LE, work on balance   Consulted and Agree with Plan of Care Patient      Patient will benefit from skilled therapeutic intervention in order to improve the following deficits and impairments:  Decreased balance, Difficulty walking, Decreased coordination  Visit Diagnosis: Difficulty in walking, not elsewhere classified  Chronic left-sided low back pain with left-sided sciatica     Problem List Patient Active Problem List   Diagnosis Date Noted  . Encephalopathy acute 09/30/2016  . Hyperkalemia 09/30/2016  . Seizure disorder (HCC) 09/30/2016  . Abnormal glucose 09/30/2016    Encarnacion ChuAshley Rindi Beechy PT, DPT 03/14/2017, 11:02 AM  [facility] [address] [place], [place], [address] Phone: [phone]   Fax:  [phone]  Name: Henry Conley MRN: [id number] Date of Birth: June 07, 1976

## 2017-03-18 ENCOUNTER — Encounter: Payer: 59 | Admitting: Physical Therapy

## 2017-03-19 ENCOUNTER — Encounter: Payer: 59 | Admitting: Physical Therapy

## 2017-03-20 ENCOUNTER — Ambulatory Visit: Payer: 59 | Admitting: Physical Therapy

## 2017-03-21 DIAGNOSIS — R269 Unspecified abnormalities of gait and mobility: Secondary | ICD-10-CM | POA: Diagnosis not present

## 2017-03-25 ENCOUNTER — Ambulatory Visit: Payer: 59 | Admitting: Physical Therapy

## 2017-03-26 ENCOUNTER — Encounter: Payer: 59 | Admitting: Physical Therapy

## 2017-04-02 ENCOUNTER — Ambulatory Visit: Payer: 59 | Attending: Internal Medicine

## 2017-04-02 DIAGNOSIS — M5442 Lumbago with sciatica, left side: Secondary | ICD-10-CM | POA: Insufficient documentation

## 2017-04-02 DIAGNOSIS — G8929 Other chronic pain: Secondary | ICD-10-CM | POA: Insufficient documentation

## 2017-04-02 DIAGNOSIS — R262 Difficulty in walking, not elsewhere classified: Secondary | ICD-10-CM | POA: Diagnosis not present

## 2017-04-02 NOTE — Therapy (Signed)
[facility] [address], [place], [address] Phone: [phone]   Fax:  [phone]  Physical Therapy Treatment  Patient Details  Name: ARMONIE STATEN MRN: [id number] Date of Birth: 1976/02/03 Referring Provider: Annice Needy  Encounter Date: 04/02/2017      PT End of Session - 04/02/17 0813    Visit Number 4   Number of Visits 8   Date for PT Re-Evaluation 04/24/17   PT Start Time 0845   PT Stop Time 0930   PT Time Calculation (min) 45 min   Activity Tolerance Patient tolerated treatment well   Behavior During Therapy [facility] for tasks assessed/performed      Past Medical History:  Diagnosis Date  . Left leg numbness   . Seizures (HCC)     History reviewed. No pertinent surgical history.  There were no vitals filed for this visit.      Subjective Assessment - 04/02/17 0813    Subjective Pt reports approximatley 4 falls since his last appointment 03/14/17. He has been inconsistent coming for therapy however reports being consistent with his HEP. States he is performing standing extensions every hour. Unsure if they are improving symptoms or note   Pertinent History Pt reports he is stressed out and depressed, he is unable to drive due to potential for this to be a seizure. Reports falls nearly daily.   How long can you walk comfortably? pt walks daily   Diagnostic tests negative MRI for brain, cervical spine, cervical disc bulge at C4-C5   Patient Stated Goals Pt wants to figure out what is going on, to decr. symptoms.   Currently in Pain? No/denies           TREATMENT   Therapeutic Exercise: Extensive history review;  HEP review;   Prone L hip extension 3 x 10; Hooklying bridges 3 x 10;  L single leg hip hinge on Airex 3 x 10;  HEP progression provided;  Manual Therapy Spinal mobility assessed without significant findings of hypomobility throughout lumbar spine. No pain reported  with CPA or L UPA from T3 through L5, unable to reproduce symptoms; No pain or symptoms reproduction with palpation of L piriformis, no spasm noted; Repeated prone extensions with overpressure at lumbar spine 2 x 10;  L1-L5 grade III mobilizations, 30s/bout x 1 bout/level;                      PT Education - 04/02/17 0813    Education provided Yes   Education Details Reinforced and progressed HEP   Person(s) Educated Patient   Methods Explanation   Comprehension Verbalized understanding             PT Long Term Goals - 04/02/17 1026      PT LONG TERM GOAL #1   Title Pt will report no falls in a 2 wk period of time   Baseline Pt reports regular falls   Time 4   Period Weeks   Status On-going     PT LONG TERM GOAL #2   Title Pt will be I with HEP to improve LE strength as seen by decr. report of inconsistent weakness.   Baseline Pt reports daily weakness which is inconsistent.   Time 4   Period Weeks   Status On-going               Plan - 04/02/17 0813    Clinical Impression Statement Pt has  one episode where his leg goes numb during therapy session. It occurs when he is laying prone on table. Pt clenches his teeth with a pained expression on his face. Lasts 6-10 seconds and then symptoms resolve. Pt denies pain during episode but states his entire leg goes numb. Provided HEP progression to encourage lumbar extension. Follow-up as scheduled.    Rehab Potential Fair   Clinical Impairments Affecting Rehab Potential motivation, age/difficulty finding deficits to treat   PT Frequency 2x / week   PT Duration 4 weeks   PT Treatment/Interventions ADLs/Self Care Home Management;Dry needling;Vestibular;Manual techniques;Neuromuscular re-education;Stair training;Functional mobility training;Balance training;Therapeutic exercise;Therapeutic activities   PT Next Visit Plan strengthen LE, work on balance   PT Home Exercise Plan Repeated standing extensions with  towel, prone press ups, hooklying bridges, prone hip extension   Consulted and Agree with Plan of Care Patient      Patient will benefit from skilled therapeutic intervention in order to improve the following deficits and impairments:  Decreased balance, Difficulty walking, Decreased coordination  Visit Diagnosis: Difficulty in walking, not elsewhere classified  Chronic left-sided low back pain with left-sided sciatica     Problem List Patient Active Problem List   Diagnosis Date Noted  . Encephalopathy acute 09/30/2016  . Hyperkalemia 09/30/2016  . Seizure disorder (HCC) 09/30/2016  . Abnormal glucose 09/30/2016   Lynnea Maizes PT, DPT   Maize Brittingham 04/02/2017, 10:28 AM  [facility] SPORTS MEDICINE [address] [place], [address] Phone: [phone]   Fax:  [phone]  Name: DAVIS AMBROSINI MRN: [id number] Date of Birth: Apr 19, 1976

## 2017-04-08 ENCOUNTER — Ambulatory Visit: Payer: 59

## 2017-04-08 DIAGNOSIS — G8929 Other chronic pain: Secondary | ICD-10-CM

## 2017-04-08 DIAGNOSIS — M5442 Lumbago with sciatica, left side: Secondary | ICD-10-CM | POA: Diagnosis not present

## 2017-04-08 DIAGNOSIS — R262 Difficulty in walking, not elsewhere classified: Secondary | ICD-10-CM | POA: Diagnosis not present

## 2017-04-08 NOTE — Therapy (Signed)
[place] [facility] [address] [place], [address] Phone: [phone]   Fax:  [phone]  Physical Therapy Treatment  Patient Details  Name: Henry Conley MRN: [id number] Date of Birth: 05-09-1976 Referring Provider: Annice Needy  Encounter Date: 04/08/2017      PT End of Session - 04/08/17 0805    Visit Number 5   Number of Visits 8   Date for PT Re-Evaluation 04/24/17   PT Start Time 0810   PT Stop Time 0855   PT Time Calculation (min) 45 min   Activity Tolerance Patient tolerated treatment well   Behavior During Therapy [facility] for tasks assessed/performed      Past Medical History:  Diagnosis Date  . Left leg numbness   . Seizures (HCC)     History reviewed. No pertinent surgical history.  There were no vitals filed for this visit.      Subjective Assessment - 04/08/17 0804    Subjective Pt states that he has been doing well since his last visit. He only had one episode of leg numbness which occurred yesterday. No specific questions or concerns at this time.    Pertinent History Pt reports he is stressed out and depressed, he is unable to drive due to potential for this to be a seizure. Reports falls nearly daily.   How long can you walk comfortably? pt walks daily   Diagnostic tests negative MRI for brain, cervical spine, cervical disc bulge at C4-C5   Patient Stated Goals Pt wants to figure out what is going on, to decr. symptoms.   Currently in Pain? No/denies          TREATMENT  Therapeutic Exercise:  Prone L hip extension 3 x 10; Hooklying L single leg bridges, RLE extensed, 3 x 10;  TRX L single leg squats 3 x 10, extensive cues for proper form/technique; L single leg hip hinge on Airex 3 x 10, first set unweighted, second and third sets with 20# kettlebell;   Manual Therapy L1-L5 grade III mobilizations, 30s/bout x 3 bouts/level; Repeated prone extensions with overpressure  (belt anchoring pelvis creating hinge along lumbar spine) spine 3 x 10 with belt positioned at different levels, oscillation x 10 at end range during last re of each set; L hip long axis distraction grade III, 30s/bout x 3 bouts;                           PT Education - 04/08/17 0804    Education provided Yes   Education Details Reinforced HEP   Person(s) Educated Patient   Methods Explanation   Comprehension Verbalized understanding             PT Long Term Goals - 04/02/17 1026      PT LONG TERM GOAL #1   Title Pt will report no falls in a 2 wk period of time   Baseline Pt reports regular falls   Time 4   Period Weeks   Status On-going     PT LONG TERM GOAL #2   Title Pt will be I with HEP to improve LE strength as seen by decr. report of inconsistent weakness.   Baseline Pt reports daily weakness which is inconsistent.   Time 4   Period Weeks   Status On-going               Plan - 04/08/17 1324  Clinical Impression Statement Pt appears to have made progress since last therapy session with only one reported episode of LLE numbness since that time. No episodes during session today. Pt encouraged to continue current HEP especially focusing on repeated extension. Follow-up as scheduled.   Rehab Potential Fair   Clinical Impairments Affecting Rehab Potential motivation, age/difficulty finding deficits to treat   PT Frequency 2x / week   PT Duration 4 weeks   PT Treatment/Interventions ADLs/Self Care Home Management;Dry needling;Vestibular;Manual techniques;Neuromuscular re-education;Stair training;Functional mobility training;Balance training;Therapeutic exercise;Therapeutic activities   PT Next Visit Plan strengthen LE, work on balance, manual therapy to low back   PT Home Exercise Plan Repeated standing extensions with towel, prone press ups, hooklying L single leg bridges, prone hip extension   Consulted and Agree with Plan of Care Patient       Patient will benefit from skilled therapeutic intervention in order to improve the following deficits and impairments:  Decreased balance, Difficulty walking, Decreased coordination  Visit Diagnosis: Difficulty in walking, not elsewhere classified  Chronic left-sided low back pain with left-sided sciatica     Problem List Patient Active Problem List   Diagnosis Date Noted  . Encephalopathy acute 09/30/2016  . Hyperkalemia 09/30/2016  . Seizure disorder (HCC) 09/30/2016  . Abnormal glucose 09/30/2016   Lynnea Maizes PT, DPT   Huprich,Jason 04/08/2017, 10:24 AM  [facility] PHYSICAL AND SPORTS MEDICINE [address] [place], [address] Phone: [phone]   Fax:  [phone]  Name: Henry Conley MRN: [id number] Date of Birth: March 04, 1976

## 2017-04-16 ENCOUNTER — Ambulatory Visit: Payer: 59

## 2017-04-16 DIAGNOSIS — G8929 Other chronic pain: Secondary | ICD-10-CM

## 2017-04-16 DIAGNOSIS — M5442 Lumbago with sciatica, left side: Secondary | ICD-10-CM | POA: Diagnosis not present

## 2017-04-16 DIAGNOSIS — R262 Difficulty in walking, not elsewhere classified: Secondary | ICD-10-CM

## 2017-04-16 NOTE — Therapy (Signed)
[place] [facility] PH[facility] SPORTS MEDICINE [address] [place], [address] Phone: [phone]   Fax:  [phone]  Physical Therapy Treatment  Patient Details  Name: Henry Conley MRN: [id number] Date of Birth: [birth year] Referring Provider: Annice Needy  Encounter Date: 04/16/2017      PT End of Session - 04/16/17 0813    Visit Number 6   Number of Visits 8   Date for PT Re-Evaluation 04/24/17   PT Start Time 0820   PT Stop Time 0900   PT Time Calculation (min) 40 min   Activity Tolerance Patient tolerated treatment well   Behavior During Therapy [facility] for tasks assessed/performed      Past Medical History:  Diagnosis Date  . Left leg numbness   . Seizures (HCC)     History reviewed. No pertinent surgical history.  There were no vitals filed for this visit.      Subjective Assessment - 04/16/17 0812    Subjective Pt reports he continues to have symptoms of LLE numbness/weakness since last session. He has not noticed any improvement in symptoms with therapy. Pt is frustrated today. He has scheduled an appointment with [facility] neurology but his appointment is not for another month.    Pertinent History Pt reports he is stressed out and depressed, he is unable to drive due to potential for this to be a seizure. Reports falls nearly daily.   How long can you walk comfortably? pt walks daily   Diagnostic tests negative MRI for brain, cervical spine, cervical disc bulge at C4-C5   Patient Stated Goals Pt wants to figure out what is going on, to decr. symptoms.   Currently in Pain? No/denies          TREATMENT  Therapeutic Exercise: Extensive history review with patient; Neurological screen: MMT and myotomes WNL and symmetrical UE/LE, Sensation testing WNL and symmetrical UE/LE, negative Hoffman's, no clonus or spasticity, negative Babinski, reflexes symmetric bilaterally: biceps: 0, triceps: 2, bradioradialis: 2, KJ: 2,  AJ: 2 Prone bilateral hip extension 3 x 10 bilateral;; Hooklying L single leg bridges, RLE extensed, 3 x 10;  TRX L single leg squats 3 x 10, cues for proper form/technique; L single leg hip hinge 2 x 10;  Manual Therapy L1-L5 grade III mobilizations, 30s/bout x 3 bouts/level; Repeated prone extensions with overpressure 3 x 10 with belt positioned at different levels, oscillation x 10 at end range during last rep of each set;                          PT Education - 04/16/17 0813    Education provided Yes   Education Details Reinforced HEP   Person(s) Educated Patient   Methods Explanation   Comprehension Verbalized understanding             PT Long Term Goals - 04/02/17 1026      PT LONG TERM GOAL #1   Title Pt will report no falls in a 2 wk period of time   Baseline Pt reports regular falls   Time 4   Period Weeks   Status On-going     PT LONG TERM GOAL #2   Title Pt will be I with HEP to improve LE strength as seen by decr. report of inconsistent weakness.   Baseline Pt reports daily weakness which is inconsistent.   Time 4   Period Weeks   Status On-going  Plan - 04/16/17 0813    Clinical Impression Statement Pt reports no change in his symptoms since starting therapy. Extensive history review with patient reveals extensive work-up by neurology. Limited neurological screening today is completely normal. Pt has no episodes today. Pt will come in for his final session later this week and then will be discharged. Encouraged pt to continue current HEP.    Rehab Potential Fair   Clinical Impairments Affecting Rehab Potential motivation, age/difficulty finding deficits to treat   PT Frequency 2x / week   PT Duration 4 weeks   PT Treatment/Interventions ADLs/Self Care Home Management;Dry needling;Vestibular;Manual techniques;Neuromuscular re-education;Stair training;Functional mobility training;Balance training;Therapeutic  exercise;Therapeutic activities   PT Next Visit Plan Update goals, discharge, strengthen LE, work on balance, manual therapy to low back   PT Home Exercise Plan Repeated standing extensions with towel, prone press ups, hooklying L single leg bridges, prone hip extension   Consulted and Agree with Plan of Care Patient      Patient will benefit from skilled therapeutic intervention in order to improve the following deficits and impairments:  Decreased balance, Difficulty walking, Decreased coordination  Visit Diagnosis: Difficulty in walking, not elsewhere classified  Chronic left-sided low back pain with left-sided sciatica     Problem List Patient Active Problem List   Diagnosis Date Noted  . Encephalopathy acute 09/30/2016  . Hyperkalemia 09/30/2016  . Seizure disorder (HCC) 09/30/2016  . Abnormal glucose 09/30/2016   Lynnea Maizes PT, DPT   Arya Boxley 04/16/2017, 9:04 AM  [place] [facility] PHYSICAL AND SPORTS MEDICINE [address], [place], [address] Phone: [phone]   Fax:  [phone]  Name: Henry Conley MRN: [id number] Date of Birth: May 03, 1976

## 2017-04-22 ENCOUNTER — Ambulatory Visit: Payer: 59

## 2017-04-30 ENCOUNTER — Ambulatory Visit: Payer: 59 | Attending: Internal Medicine

## 2017-04-30 DIAGNOSIS — R262 Difficulty in walking, not elsewhere classified: Secondary | ICD-10-CM | POA: Insufficient documentation

## 2017-04-30 DIAGNOSIS — M5442 Lumbago with sciatica, left side: Secondary | ICD-10-CM | POA: Diagnosis not present

## 2017-04-30 DIAGNOSIS — G8929 Other chronic pain: Secondary | ICD-10-CM | POA: Diagnosis not present

## 2017-04-30 NOTE — Therapy (Signed)
[facility] PHYS[facility] SPORTS MEDICINE [address] [place], [address] Phone: [phone]   Fax:  [phone]  Physical Therapy Treatment/Discharge Summary  Patient Details  Name: Henry Conley MRN: [id number] Date of Birth: August 19, 1976 Referring Provider: Annice Needy  Encounter Date: 04/30/2017      PT End of Session - 04/30/17 1239    Visit Number 7   Number of Visits 8   Date for PT Re-Evaluation 04/24/17   PT Start Time 0950   PT Stop Time 1015   PT Time Calculation (min) 25 min   Activity Tolerance Patient tolerated treatment well   Behavior During Therapy [facility] for tasks assessed/performed      Past Medical History:  Diagnosis Date  . Left leg numbness   . Seizures (HCC)     History reviewed. No pertinent surgical history.  There were no vitals filed for this visit.      Subjective Assessment - 04/30/17 0953    Subjective Patient reports he continues to have numbness symptoms extending into both his LEs. Patient is unable to place a pattern to the numbness in his legs. Patient says his numbness most often onsets with prolonged sitting; but is not always when he is sitting.    Pertinent History Pt reports he is stressed out and depressed, he is unable to drive due to potential for this to be a seizure. Reports falls nearly daily.   How long can you walk comfortably? pt walks daily   Diagnostic tests negative MRI for brain, cervical spine, cervical disc bulge at C4-C5   Patient Stated Goals Pt wants to figure out what is going on, to decr. symptoms.   Currently in Pain? No/denies     Observation: SLUMP: (-) bilaterally Spring testing lumbar spine -- L5 central P->A elicits increased discomfort and pain; all other lumbar and sacral levels are WNL  Treatment: Hip extension in prone -- 2 x 10 B Hip abduction in sidelying -- 2 x 10  Standing closed kinetic chain lumbar extension -- x 20 Prone press ups -- x 10    Patient demonstrates no increased pain by end of the session         PT Education - 04/30/17 1238    Education provided Yes   Education Details Educated on POC and added lumbar extension in standing to HEP   Person(s) Educated Patient   Methods Explanation;Demonstration   Comprehension Verbalized understanding;Returned demonstration             PT Long Term Goals - 04/02/17 1026      PT LONG TERM GOAL #1   Title Pt will report no falls in a 2 wk period of time   Baseline Pt reports regular falls   Time 4   Period Weeks   Status On-going     PT LONG TERM GOAL #2   Title Pt will be I with HEP to improve LE strength as seen by decr. report of inconsistent weakness.   Baseline Pt reports daily weakness which is inconsistent.   Time 4   Period Weeks   Status On-going               Plan - 04/30/17 1240    Clinical Impression Statement Pt reports no change in symptoms since the beginning of therapy. Patient states he continues to get the numbness down B LEs which happens randomly. Patient does not respond to any exercises during therapy. Educated patient to perform  lumbar extension in standing throughout the day to see if symptoms are minimized. D/C'ing from physical therapy secondary to no improvement in symptoms and subsequent follow up.    Rehab Potential Fair   Clinical Impairments Affecting Rehab Potential motivation, age/difficulty finding deficits to treat   PT Frequency 2x / week   PT Duration 4 weeks   PT Treatment/Interventions ADLs/Self Care Home Management;Dry needling;Vestibular;Manual techniques;Neuromuscular re-education;Stair training;Functional mobility training;Balance training;Therapeutic exercise;Therapeutic activities   PT Next Visit Plan Update goals, discharge, strengthen LE, work on balance, manual therapy to low back   PT Home Exercise Plan Repeated standing extensions with towel, prone press ups, hooklying L single leg bridges, prone hip  extension   Consulted and Agree with Plan of Care Patient      Patient will benefit from skilled therapeutic intervention in order to improve the following deficits and impairments:  Decreased balance, Difficulty walking, Decreased coordination  Visit Diagnosis: Difficulty in walking, not elsewhere classified  Chronic left-sided low back pain with left-sided sciatica     Problem List Patient Active Problem List   Diagnosis Date Noted  . Encephalopathy acute 09/30/2016  . Hyperkalemia 09/30/2016  . Seizure disorder (HCC) 09/30/2016  . Abnormal glucose 09/30/2016    Myrene Galas, PT DPT 04/30/2017, 12:46 PM  [facility] PHYSICAL AND SPORTS MEDICINE [address] [place], [address] Phone: [phone]   Fax:  [phone]  Name: RANDE ROYLANCE MRN: [id number] Date of Birth: [birth year]

## 2017-05-06 ENCOUNTER — Ambulatory Visit: Payer: 59

## 2017-06-05 DIAGNOSIS — R569 Unspecified convulsions: Secondary | ICD-10-CM | POA: Diagnosis not present

## 2017-07-01 ENCOUNTER — Other Ambulatory Visit: Payer: Self-pay

## 2017-07-01 NOTE — Patient Outreach (Signed)
[facility]) Care Management  07/01/2017  Henry KaufmanCarlos O Conley 02/21/1976 [id number]   Subjective: none.  Objective: Per chart review, patient with unspecified convulsions  [facility] [place] [place]   Assessment:  Received [facility] Pre-surgical call referral on 06/24/17. Telephone call to patient's home / mobile number, no answer. HIPPA compliant voice message left.   Plan: Await return call. Pre-surgical call pending patient contact.  Henry SheriffJuana Amarie Tarte, RN, MSN, [facility],CCM [facility] Community Care Coordinator Cell: [phone]

## 2017-07-02 ENCOUNTER — Other Ambulatory Visit: Payer: Self-pay

## 2017-07-02 NOTE — Patient Outreach (Signed)
[facility]) Care Management  07/02/2017  Mariana KaufmanCarlos O Tobin [birth year] [id number]   Subjective: none.  Objective: Per chart review, patient with unspecified convulsions  Is [facility] [place] [place]   Assessment:  Received [facility] Pre-surgical call referral on 06/24/17. Telephone call to patient's home / mobile number, no answer. HIPPA compliant voice message left.   Plan: Await return call. Pre-surgical call pending patient contact.  Kathyrn SheriffJuana Tarri Guilfoil, RN, MSN, [facility],CCM [facility] Community Care Coordinator Cell: [phone]

## 2017-07-04 ENCOUNTER — Other Ambulatory Visit: Payer: Self-pay

## 2017-07-04 NOTE — Patient Outreach (Signed)
[facility]) Care Management  07/04/2017  Henry KaufmanCarlos O Conley 07/12/1976 [id number]   Subjective:none.  Objective:Per chart review, patient withunspecified convulsions Is scheduled [facility] a [facility] [place].   Assessment: Received [facility] Pre-surgical call referral on 06/24/17. Telephone call to patient's home / mobile number, no answer. Unable to leave message left.   Plan: Pre-surgical call pending patient contact.  Henry SheriffJuana Will Heinkel, RN, MSN, [facility],CCM [facility] Community Care Coordinator Cell: [phone]

## 2017-07-06 DIAGNOSIS — G40909 Epilepsy, unspecified, not intractable, without status epilepticus: Secondary | ICD-10-CM | POA: Diagnosis not present

## 2017-07-06 DIAGNOSIS — G40109 Localization-related (focal) (partial) symptomatic epilepsy and epileptic syndromes with simple partial seizures, not intractable, without status epilepticus: Secondary | ICD-10-CM | POA: Diagnosis not present

## 2017-07-06 DIAGNOSIS — R569 Unspecified convulsions: Secondary | ICD-10-CM | POA: Diagnosis not present

## 2017-07-06 DIAGNOSIS — G40209 Localization-related (focal) (partial) symptomatic epilepsy and epileptic syndromes with complex partial seizures, not intractable, without status epilepticus: Secondary | ICD-10-CM | POA: Diagnosis not present

## 2017-08-07 DIAGNOSIS — G40219 Localization-related (focal) (partial) symptomatic epilepsy and epileptic syndromes with complex partial seizures, intractable, without status epilepticus: Secondary | ICD-10-CM | POA: Diagnosis not present

## 2017-09-25 DIAGNOSIS — G40219 Localization-related (focal) (partial) symptomatic epilepsy and epileptic syndromes with complex partial seizures, intractable, without status epilepticus: Secondary | ICD-10-CM | POA: Diagnosis not present

## 2017-10-09 DIAGNOSIS — G40209 Localization-related (focal) (partial) symptomatic epilepsy and epileptic syndromes with complex partial seizures, not intractable, without status epilepticus: Secondary | ICD-10-CM | POA: Diagnosis not present

## 2017-10-25 IMAGING — MR MR HEAD WO/W CM
8 of 11 series · 33 of 48 positions shown · IV contrast (multihance)
Comparison: Head CT 09/30/2016 and MRI 04/30/2016

CLINICAL DATA: Acute encephalopathy. Altered mental status for a
couple days. Seizure like activity. Headache.

EXAM:
MRI HEAD WITHOUT AND WITH CONTRAST
TECHNIQUE: Multiplanar, multiecho pulse sequences of the brain and surrounding
structures were obtained without and with intravenous contrast.
CONTRAST:  13mL MULTIHANCE GADOBENATE DIMEGLUMINE 529 MG/ML IV SOLN

[Series 8: DWI · axial · 4.0mm · 0.94mm/px · z∈[-87,+87]mm · 9 of 133 slices shown (1 of 2)]
[im 1/133]
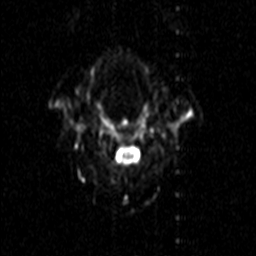
[im 25/133]
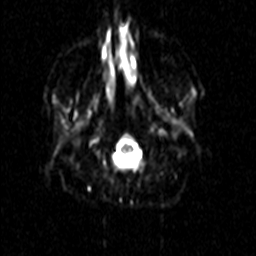
[im 37/133]
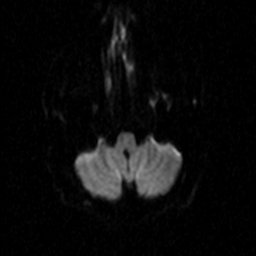
[im 61/133]
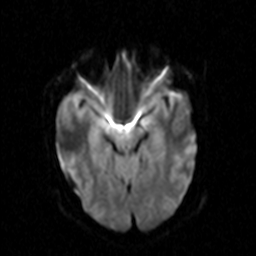
[im 73/133]
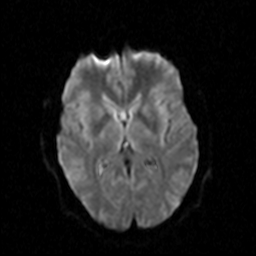
[im 97/133]
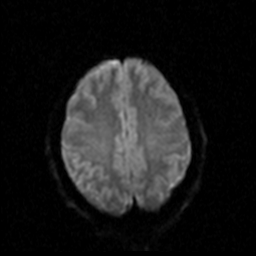
[im 109/133]
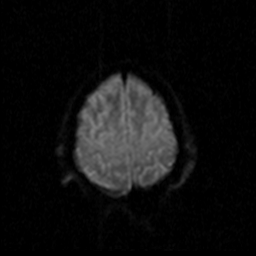
[im 121/133]
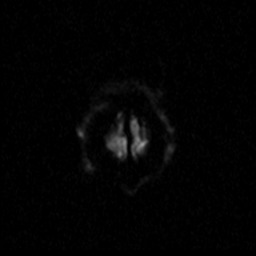
[im 133/133]
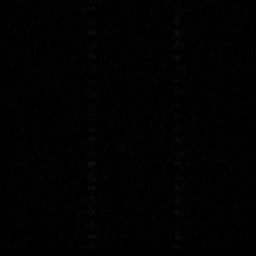

[Series 9: DWI · axial · 4.0mm · 0.94mm/px · z∈[-87,+87]mm · 3 of 45 slices shown (2 of 2)]
[im 1/45]
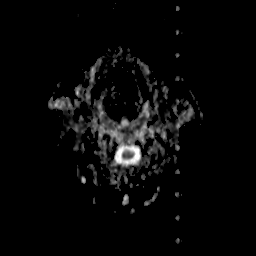
[im 23/45]
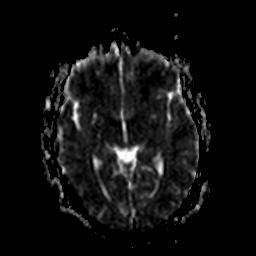
[im 45/45]
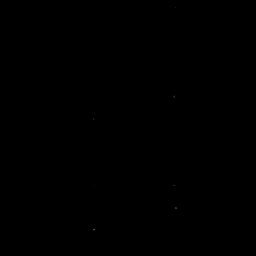

[Series 10: FLAIR · axial · 5.0mm · 0.90mm/px · z∈[-82,+84]mm · 3 of 27 slices shown]
[im 1/27]
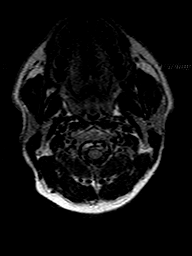
[im 14/27]
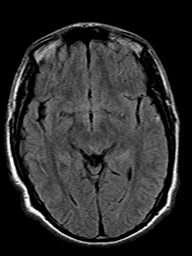
[im 27/27]
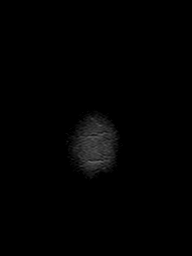

[Series 11: T2 · axial · 5.0mm · 0.45mm/px · z∈[-82,+84]mm · 3 of 27 slices shown (1 of 2)]
[im 1/27]
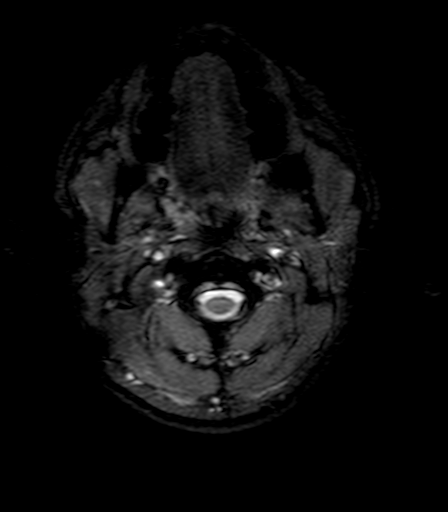
[im 14/27]
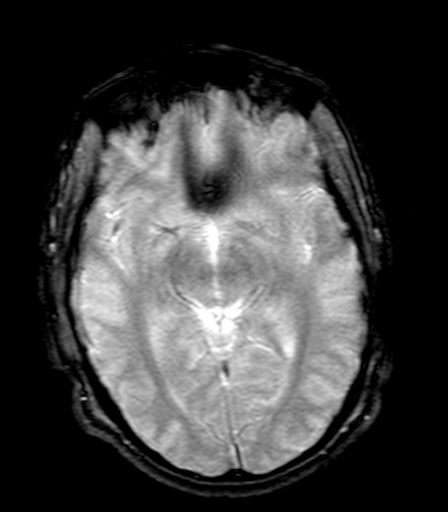
[im 27/27]
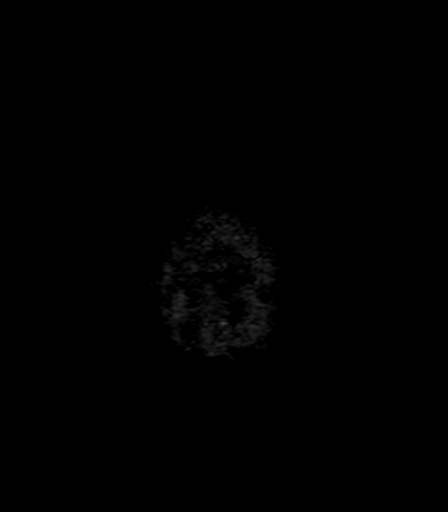

[Series 12: T2 · axial · 5.0mm · 0.45mm/px · z∈[-82,+84]mm · 3 of 27 slices shown (2 of 2)]
[im 1/27]
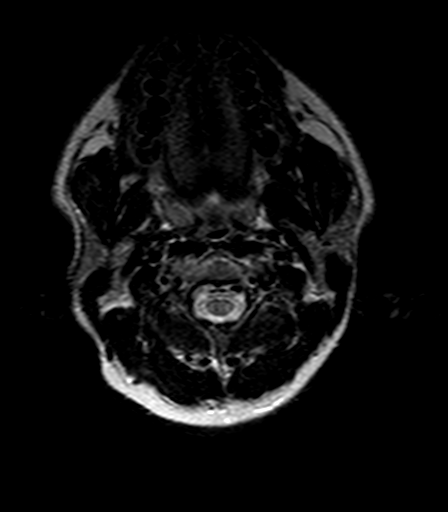
[im 14/27]
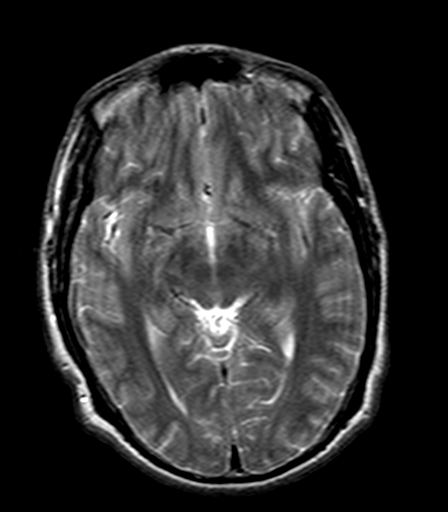
[im 27/27]
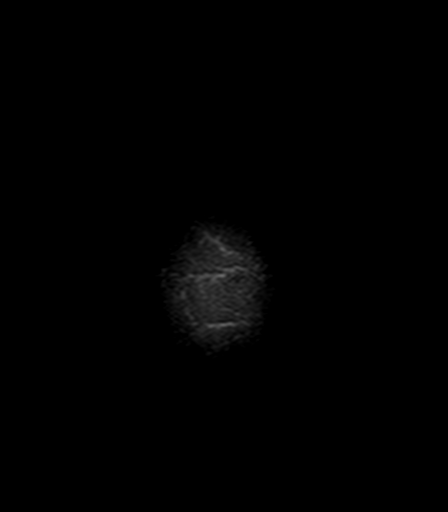

[Series 16: T1 post-contrast · axial · 3.0mm · 0.45mm/px · z∈[-86,+88]mm · 6 of 60 slices shown (1 of 3)]
[im 1/60]
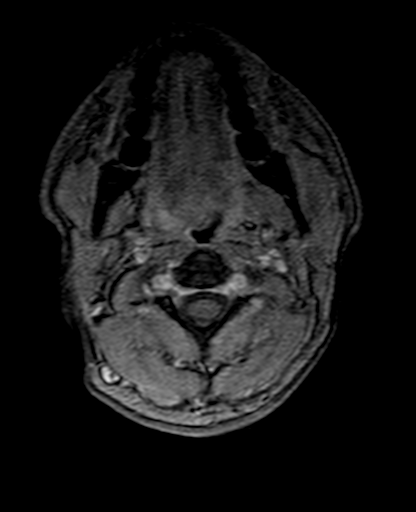
[im 12/60]
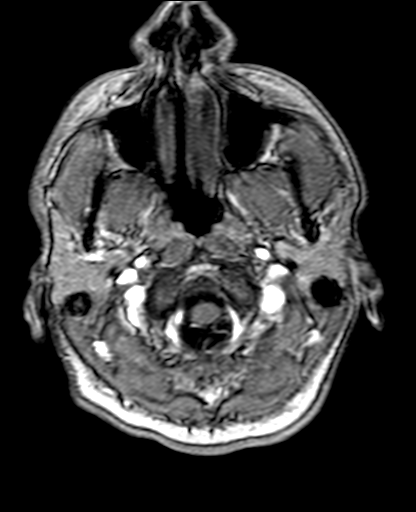
[im 24/60]
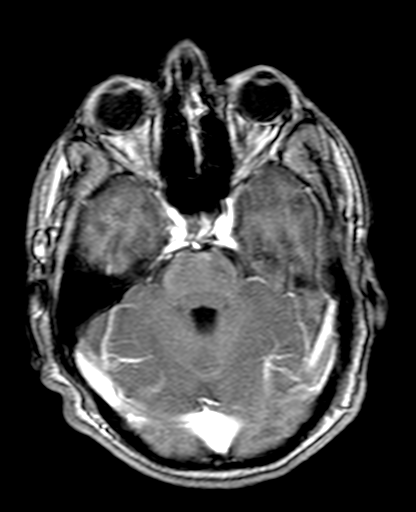
[im 36/60]
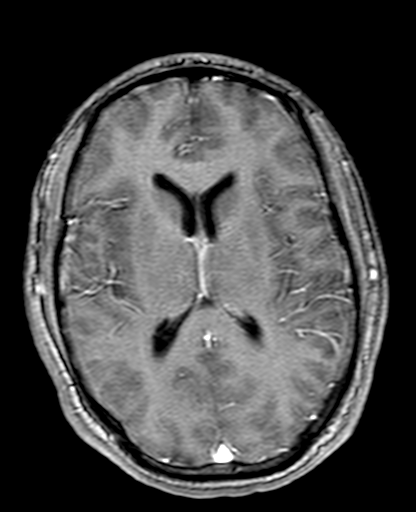
[im 48/60]
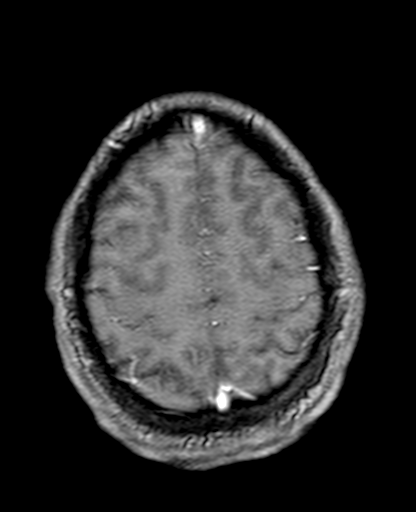
[im 60/60]
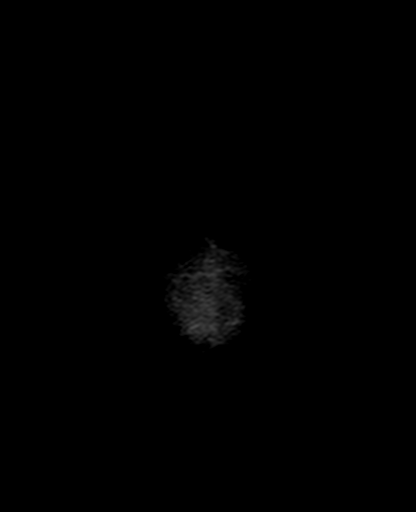

[Series 17: T1 post-contrast · coronal · 5.0mm · 0.45mm/px · 3 of 33 slices shown (2 of 3)]
[im 1/33]
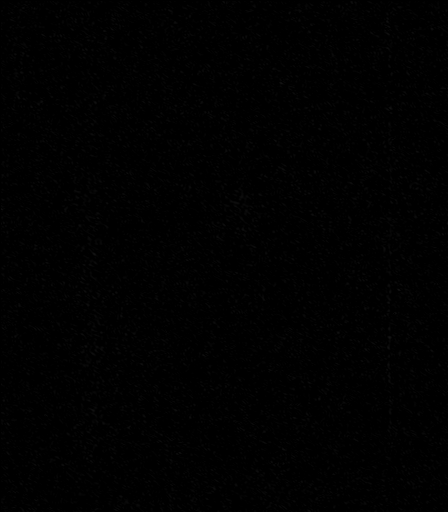
[im 17/33]
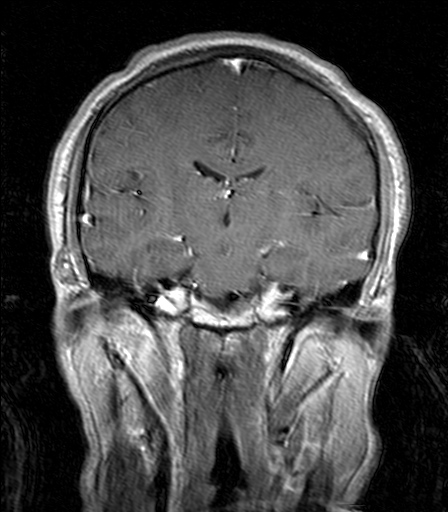
[im 33/33]
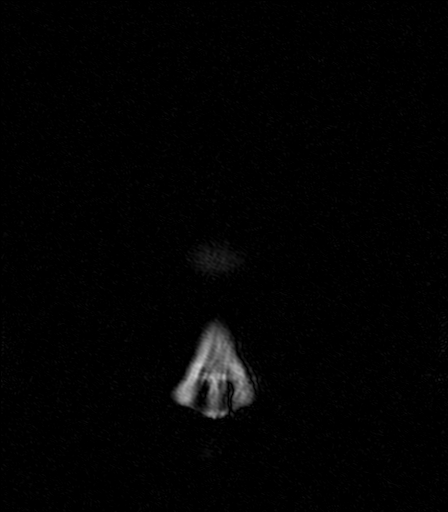

[Series 18: T1 post-contrast · sagittal · 5.0mm · 0.45mm/px · 3 of 29 slices shown (3 of 3)]
[im 1/29]
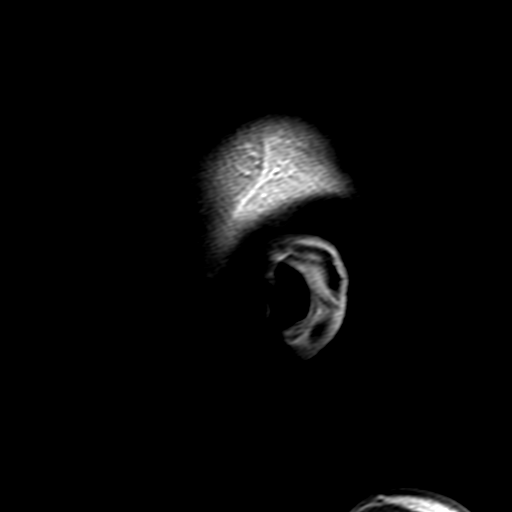
[im 15/29]
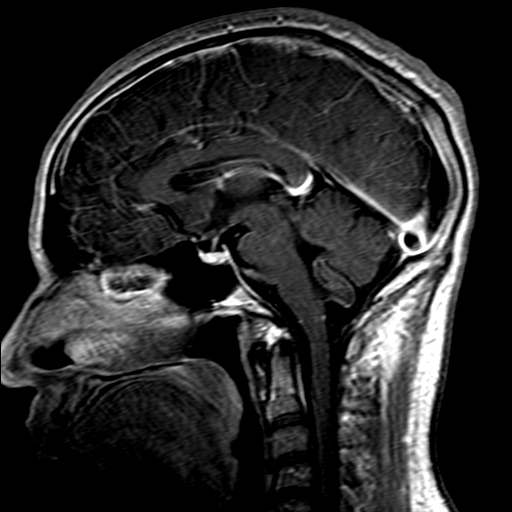
[im 29/29]
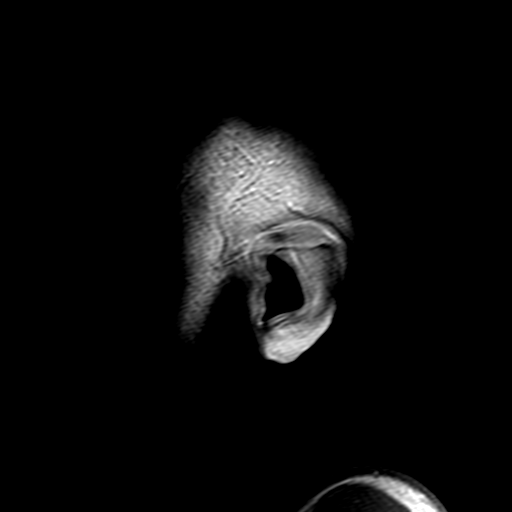

[33 of 48 positions shown; findings below may reference images not displayed]

FINDINGS: The study is mildly motion degraded.

Brain: There is no evidence of acute infarct, intracranial
hemorrhage, mass, midline shift, or extra-axial fluid collection.
The ventricles and sulci are normal. The brain is normal in signal.
No abnormal enhancement is identified.

Vascular: Major intracranial vascular flow voids are preserved.

Skull and upper cervical spine: Unremarkable bone marrow signal.

Sinuses/Orbits: Unremarkable.

Other: None.
IMPRESSION: Unremarkable brain MRI.

## 2018-07-02 DIAGNOSIS — G40209 Localization-related (focal) (partial) symptomatic epilepsy and epileptic syndromes with complex partial seizures, not intractable, without status epilepticus: Secondary | ICD-10-CM | POA: Diagnosis not present

## 2018-10-20 DIAGNOSIS — G40209 Localization-related (focal) (partial) symptomatic epilepsy and epileptic syndromes with complex partial seizures, not intractable, without status epilepticus: Secondary | ICD-10-CM | POA: Diagnosis not present

## 2019-01-28 DIAGNOSIS — G40209 Localization-related (focal) (partial) symptomatic epilepsy and epileptic syndromes with complex partial seizures, not intractable, without status epilepticus: Secondary | ICD-10-CM | POA: Diagnosis not present

## 2019-03-08 ENCOUNTER — Encounter: Payer: Self-pay | Admitting: Emergency Medicine

## 2019-03-08 ENCOUNTER — Observation Stay
Admission: EM | Admit: 2019-03-08 | Discharge: 2019-03-09 | Disposition: A | Discharge status: Home / Self Care | Payer: 59 | Attending: Internal Medicine | Admitting: Internal Medicine

## 2019-03-08 ENCOUNTER — Emergency Department: Payer: 59

## 2019-03-08 DIAGNOSIS — R059 Cough, unspecified: Secondary | ICD-10-CM

## 2019-03-08 DIAGNOSIS — G40209 Localization-related (focal) (partial) symptomatic epilepsy and epileptic syndromes with complex partial seizures, not intractable, without status epilepticus: Principal | ICD-10-CM | POA: Insufficient documentation

## 2019-03-08 DIAGNOSIS — R05 Cough: Secondary | ICD-10-CM | POA: Insufficient documentation

## 2019-03-08 DIAGNOSIS — R569 Unspecified convulsions: Secondary | ICD-10-CM

## 2019-03-08 DIAGNOSIS — Z79899 Other long term (current) drug therapy: Secondary | ICD-10-CM | POA: Insufficient documentation

## 2019-03-08 DIAGNOSIS — G40802 Other epilepsy, not intractable, without status epilepticus: Secondary | ICD-10-CM | POA: Diagnosis present

## 2019-03-08 DIAGNOSIS — R0902 Hypoxemia: Secondary | ICD-10-CM | POA: Diagnosis not present

## 2019-03-08 HISTORY — DX: Other epilepsy, not intractable, without status epilepticus: G40.802

## 2019-03-08 LAB — COMPREHENSIVE METABOLIC PANEL
ALT: 49 U/L — AB (ref 0–44)
AST: 60 U/L — ABNORMAL HIGH (ref 15–41)
Albumin: 4.5 g/dL (ref 3.5–5.0)
Alkaline Phosphatase: 58 U/L (ref 38–126)
Anion gap: 10 (ref 5–15)
BUN: 16 mg/dL (ref 6–20)
CO2: 23 mmol/L (ref 22–32)
Calcium: 8.9 mg/dL (ref 8.9–10.3)
Chloride: 107 mmol/L (ref 98–111)
Creatinine, Ser: 0.98 mg/dL (ref 0.61–1.24)
Glucose, Bld: 97 mg/dL (ref 70–99)
Potassium: 3.7 mmol/L (ref 3.5–5.1)
Sodium: 140 mmol/L (ref 135–145)
Total Bilirubin: 1.8 mg/dL — ABNORMAL HIGH (ref 0.3–1.2)
Total Protein: 7.8 g/dL (ref 6.5–8.1)

## 2019-03-08 LAB — SALICYLATE LEVEL

## 2019-03-08 LAB — CBC WITH DIFFERENTIAL/PLATELET
Abs Immature Granulocytes: 0.01 10*3/uL (ref 0.00–0.07)
Basophils Absolute: 0 10*3/uL (ref 0.0–0.1)
Basophils Relative: 1 %
EOS PCT: 4 %
Eosinophils Absolute: 0.3 10*3/uL (ref 0.0–0.5)
HEMATOCRIT: 48.6 % (ref 39.0–52.0)
HEMOGLOBIN: 16.9 g/dL (ref 13.0–17.0)
IMMATURE GRANULOCYTES: 0 %
LYMPHS ABS: 1.2 10*3/uL (ref 0.7–4.0)
LYMPHS PCT: 16 %
MCH: 32.8 pg (ref 26.0–34.0)
MCHC: 34.8 g/dL (ref 30.0–36.0)
MCV: 94.4 fL (ref 80.0–100.0)
Monocytes Absolute: 0.9 10*3/uL (ref 0.1–1.0)
Monocytes Relative: 11 %
NEUTROS PCT: 68 %
Neutro Abs: 5.3 10*3/uL (ref 1.7–7.7)
Platelets: 208 10*3/uL (ref 150–400)
RBC: 5.15 MIL/uL (ref 4.22–5.81)
RDW: 11.6 % (ref 11.5–15.5)
WBC: 7.8 10*3/uL (ref 4.0–10.5)
nRBC: 0 % (ref 0.0–0.2)

## 2019-03-08 LAB — ACETAMINOPHEN LEVEL: Acetaminophen (Tylenol), Serum: <10 ug/mL — ABNORMAL LOW (ref 10–30)

## 2019-03-08 LAB — ETHANOL

## 2019-03-08 MED ORDER — LAMOTRIGINE 100 MG PO TABS
200.0000 mg | ORAL_TABLET | Freq: Two times a day (BID) | ORAL | Status: DC
  Administered 2019-03-09 (×2): 200 mg via ORAL
  Filled 2019-03-08 (×2): qty 2

## 2019-03-08 MED ORDER — SODIUM CHLORIDE 0.9 % IV BOLUS
1000.0000 mL | Freq: Once | INTRAVENOUS | Status: AC
Start: 2019-03-08 — End: 2019-03-09
  Administered 2019-03-08: 1000 mL via INTRAVENOUS

## 2019-03-08 NOTE — ED Triage Notes (Signed)
Pt arrived via EMS from home where report that pt has had multiple seizures, 5 witnessed lasting 10-20 seconds each by EMS and 2 with this RN. Pt is able to follow commands on arrival. Pt drowsy. Pt received 5mg  Versed in route. Lamictal is seizure medication the pt is compliant with daily.

## 2019-03-08 NOTE — H&P (Signed)
[facility] [place] [facility]   PATIENT NAME: Henry Conley    MR#:  [id number]  DATE OF BIRTH:  10-06-1976  DATE OF ADMISSION:  03/08/2019  PRIMARY CARE PHYSICIAN: McLean-Scocuzza, Pasty Spillers, MD   REQUESTING/REFERRING PHYSICIAN: Scotty Court, MD  CHIEF COMPLAINT:   Chief Complaint  Patient presents with  . Seizures    HISTORY OF PRESENT ILLNESS:  Henry Conley  is a 43 y.o. male who presents with chief complaint as above.  Patient presents to the ED due to increased seizure activity.  He has a history of frontal lobe epilepsy, and is followed by neurology at [facility].  At baseline he tends to have a few seizures a day.  His mother describes the seizures as brief, episodes where he will "drop and then get right back up".  She states that recently he has had increased frequency of seizures, and that now his seizures include right sided thoracic muscle cramping and hand contraction as well as leg contraction.  Patient is able to wake up and respond appropriately to questions tonight, but does not provide much information on history.  [facility] neurology was called by ED physician and they recommended increasing the patient's dose of Lamictal to 200 mg and observing him overnight.  This was given in the ED.  Hospitalist were called for admission.  PAST MEDICAL HISTORY:   Past Medical History:  Diagnosis Date  . Frontal lobe epilepsy (HCC)   . Left leg numbness      PAST SURGICAL HISTORY:   Past Surgical History:  Procedure Laterality Date  . NO PAST SURGERIES       SOCIAL HISTORY:   Social History   Tobacco Use  . Smoking status: Never Smoker  . Smokeless tobacco: Never Used  Substance Use Topics  . Alcohol use: No     FAMILY HISTORY:   Family History  Problem Relation Age of Onset  . Heart disease Mother   . Healthy Father      DRUG ALLERGIES:  No Known Allergies  MEDICATIONS AT HOME:   Prior to Admission medications    Medication Sig Start Date End Date Taking? Authorizing Provider  Multiple Vitamin (MULTI-VITAMINS) TABS Take 1 tablet by mouth every morning.     [provider]  phenytoin (DILANTIN) 300 MG ER capsule Take 1 capsule (300 mg total) by mouth at bedtime. 10/02/16   Altamese Dilling, MD    REVIEW OF SYSTEMS:  Review of Systems  Constitutional: Negative for chills, fever, malaise/fatigue and weight loss.  HENT: Negative for ear pain, hearing loss and tinnitus.   Eyes: Negative for blurred vision, double vision, pain and redness.  Respiratory: Negative for cough, hemoptysis and shortness of breath.   Cardiovascular: Negative for chest pain, palpitations, orthopnea and leg swelling.  Gastrointestinal: Negative for abdominal pain, constipation, diarrhea, nausea and vomiting.  Genitourinary: Negative for dysuria, frequency and hematuria.  Musculoskeletal: Negative for back pain, joint pain and neck pain.  Skin:       No acne, rash, or lesions  Neurological: Positive for seizures. Negative for dizziness, tremors, focal weakness and weakness.  Endo/Heme/Allergies: Negative for polydipsia. Does not bruise/bleed easily.  Psychiatric/Behavioral: Negative for depression. The patient is not nervous/anxious and does not have insomnia.      VITAL SIGNS:   Vitals:   03/08/19 2025  BP: (!) 138/96  Pulse: 87  Resp: 17  Temp: 98.6 F (37 C)  TempSrc: Oral  SpO2: 95%   Wt  Readings from Last 3 Encounters:  10/02/16 67.2 kg  09/17/16 69.6 kg  04/30/16 75.8 kg    PHYSICAL EXAMINATION:  Physical Exam  Vitals reviewed. Constitutional: He is oriented to person, place, and time. He appears well-developed and well-nourished. No distress.  HENT:  Head: Normocephalic and atraumatic.  Mouth/Throat: Oropharynx is clear and moist.  Eyes: Pupils are equal, round, and reactive to light. Conjunctivae and EOM are normal. No scleral icterus.  Neck: Normal range of motion. Neck supple. No JVD  present. No thyromegaly present.  Cardiovascular: Normal rate, regular rhythm and intact distal pulses. Exam reveals no gallop and no friction rub.  No murmur heard. Respiratory: Effort normal and breath sounds normal. No respiratory distress. He has no wheezes. He has no rales.  GI: Soft. Bowel sounds are normal. He exhibits no distension. There is no abdominal tenderness.  Musculoskeletal: Normal range of motion.        General: No edema.     Comments: No arthritis, no gout  Lymphadenopathy:    He has no cervical adenopathy.  Neurological: He is alert and oriented to person, place, and time. No cranial nerve deficit.  No dysarthria, no aphasia  Skin: Skin is warm and dry. No rash noted. No erythema.  Psychiatric: He has a normal mood and affect. His behavior is normal. Judgment and thought content normal.    LABORATORY PANEL:   CBC Recent Labs  Lab 03/08/19 2027  WBC 7.8  HGB 16.9  HCT 48.6  PLT 208   ------------------------------------------------------------------------------------------------------------------  Chemistries  Recent Labs  Lab 03/08/19 2027  NA 140  K 3.7  CL 107  CO2 23  GLUCOSE 97  BUN 16  CREATININE 0.98  CALCIUM 8.9  AST 60*  ALT 49*  ALKPHOS 58  BILITOT 1.8*   ------------------------------------------------------------------------------------------------------------------  Cardiac Enzymes No results for input(s): TROPONINI in the last 168 hours. ------------------------------------------------------------------------------------------------------------------  RADIOLOGY:  Dg Chest Portable 1 View  Result Date: 03/08/2019 CLINICAL DATA:  Seizures. EXAM: PORTABLE CHEST 1 VIEW COMPARISON:  September 30, 2016 FINDINGS: The heart size and mediastinal contours are within normal limits. Both lungs are clear. The visualized skeletal structures are unremarkable. IMPRESSION: No active disease. Electronically Signed   By: Gerome Sam III M.D   On:  03/08/2019 23:31    EKG:   Orders placed or performed during the hospital encounter of 09/30/16  . ED EKG  . ED EKG    IMPRESSION AND PLAN:  Principal Problem:   Frontal lobe epilepsy (HCC) -Lamictal dose increased per recommendation by [facility] neurology service.  Will admit him initially for observation tonight.  We will get a neurology consult for any further recommendations  Chart review performed and case discussed with ED provider. Labs, imaging and/or ECG reviewed by provider and discussed with patient/family. Management plans discussed with the patient and/or family.  DVT PROPHYLAXIS: SubQ lovenox   GI PROPHYLAXIS:  None  ADMISSION STATUS: Observation  CODE STATUS: Full Code Status History    Date Active Date Inactive Code Status Order ID Comments User Context   09/30/2016 1632 10/02/2016 1826 Full Code [id number]  Marguarite Arbour, MD Inpatient      TOTAL TIME TAKING CARE OF THIS PATIENT: 40 minutes.   Barney Drain 03/08/2019, 11:46 PM  [facility]  Office  [phone]  CC: Primary care physician; McLean-Scocuzza, Pasty Spillers, MD  Note:  This document was prepared using Dragon voice recognition software and may include unintentional dictation errors.

## 2019-03-08 NOTE — ED Provider Notes (Signed)
[facility] Emergency Department Provider Note  ____________________________________________  Time seen: Approximately 8:24 PM  I have reviewed the triage vital signs and the nursing notes.   HISTORY  Chief Complaint Seizures    Level 5 Caveat: Portions of the History and Physical including HPI and review of systems are unable to be completely obtained due to patient being a poor historian   HPI Henry Conley is a 43 y.o. male with a history of epilepsy managed by [facility] neurology who is sent to the ED today due to multiple seizures.  Reviewed electronic medical record and EMS report, patient normally has about 4 or 5 seizures a week lasting a few seconds at a time.  Family and EMS do not report any acute changes or missed medication doses or recent trauma or illness.  They report that he had about 5 seizures at home lasting 10 to 20 seconds each and then was transported by EMS who also noted another 4 5 seizure episodes.  He was given 5 mg of Versed on route for seizure control.    Past Medical History:  Diagnosis Date  . Frontal lobe epilepsy (HCC)   . Left leg numbness      Patient Active Problem List   Diagnosis Date Noted  . Encephalopathy acute 09/30/2016  . Hyperkalemia 09/30/2016  . Frontal lobe epilepsy (HCC) 09/30/2016  . Abnormal glucose 09/30/2016     Past Surgical History:  Procedure Laterality Date  . NO PAST SURGERIES       Prior to Admission medications   Medication Sig Start Date End Date Taking? Authorizing Provider  Multiple Vitamin (MULTI-VITAMINS) TABS Take 1 tablet by mouth every morning.     [provider]  phenytoin (DILANTIN) 300 MG ER capsule Take 1 capsule (300 mg total) by mouth at bedtime. 10/02/16   Altamese Dilling, MD     Allergies Patient has no known allergies.   Family History  Problem Relation Age of Onset  . Heart disease Mother   . Healthy Father     Social History Social  History   Tobacco Use  . Smoking status: Never Smoker  . Smokeless tobacco: Never Used  Substance Use Topics  . Alcohol use: No  . Drug use: No    Review of Systems Level 5 Caveat: Portions of the History and Physical including HPI and review of systems are unable to be completely obtained due to patient being a poor historian   Constitutional:   No known fever.  ENT:   No rhinorrhea. Cardiovascular:   No chest pain or syncope. Respiratory:   No dyspnea or cough. Gastrointestinal:   Negative for abdominal pain, vomiting and diarrhea.  Musculoskeletal:   Negative for focal pain or swelling ____________________________________________   PHYSICAL EXAM:  VITAL SIGNS: ED Triage Vitals  Enc Vitals Group     BP      Pulse      Resp      Temp      Temp src      SpO2      Weight      Height      Head Circumference      Peak Flow      Pain Score      Pain Loc      Pain Edu?      Excl. in GC?     Vital signs reviewed, nursing assessments reviewed.   Constitutional:   Awake, alert, not oriented.  Non-toxic appearance.  Eyes:   Conjunctivae are normal. EOMI. PERRL. ENT      Head:   Normocephalic and atraumatic.      Nose:   No congestion/rhinnorhea.       Mouth/Throat:   MMM, no pharyngeal erythema. No peritonsillar mass.  No tongue laceration or intraoral injury.      Neck:   No meningismus. Full ROM. Hematological/Lymphatic/Immunilogical:   No cervical lymphadenopathy. Cardiovascular:   RRR. Symmetric bilateral radial and DP pulses.  No murmurs. Cap refill less than 2 seconds. Respiratory:   Normal respiratory effort without tachypnea/retractions. Breath sounds are clear and equal bilaterally. No wheezes/rales/rhonchi. Gastrointestinal:   Soft and nontender. Non distended. There is no CVA tenderness.  No rebound, rigidity, or guarding. Genitourinary:   deferred Musculoskeletal:   Normal range of motion in all extremities. No joint effusions.  No lower extremity  tenderness.  No edema. Neurologic:   Normal speech, sparse language.  Intermittently following commands but unable to follow multistep instructions Motor grossly intact. No acute focal neurologic deficits are appreciated.   During my evaluation, patient had an episode during which he tensed up his chest and arms, appeared to be leaning forward, and uttering out loud "oh my God."  I am told that this is typical of the patient's seizure pattern.  After 5 to 10 seconds patient relaxed and seem more confused again.  Skin:    Skin is warm, dry and intact. No rash noted.  No petechiae, purpura, or bullae.  ____________________________________________    LABS (pertinent positives/negatives) (all labs ordered are listed, but only abnormal results are displayed) Labs Reviewed  COMPREHENSIVE METABOLIC PANEL - Abnormal; Notable for the following components:      Result Value   AST 60 (*)    ALT 49 (*)    Total Bilirubin 1.8 (*)    All other components within normal limits  ACETAMINOPHEN LEVEL - Abnormal; Notable for the following components:   Acetaminophen (Tylenol), Serum <10 (*)    All other components within normal limits  CBC WITH DIFFERENTIAL/PLATELET  ETHANOL  SALICYLATE LEVEL  LAMOTRIGINE LEVEL  URINE DRUG SCREEN, QUALITATIVE ([facility] ONLY)  INFLUENZA PANEL BY PCR (TYPE A & B)   ____________________________________________   EKG    ____________________________________________    RADIOLOGY  Dg Chest Portable 1 View  Result Date: 03/08/2019 CLINICAL DATA:  Seizures. EXAM: PORTABLE CHEST 1 VIEW COMPARISON:  September 30, 2016 FINDINGS: The heart size and mediastinal contours are within normal limits. Both lungs are clear. The visualized skeletal structures are unremarkable. IMPRESSION: No active disease. Electronically Signed   By: Gerome Sam III M.D   On: 03/08/2019 23:31     ____________________________________________   PROCEDURES Procedures  ____________________________________________    CLINICAL IMPRESSION / ASSESSMENT AND PLAN / ED COURSE  Pertinent labs & imaging results that were available during my care of the patient were reviewed by me and considered in my medical decision making (see chart for details).    Patient presents with altered mental status, increased seizure frequency.  No apparent illness or substance abuse or trauma.  Will check labs, hydrate.  Clinical Course as of Mar 08 2355  Wynelle Link Mar 08, 2019  2253 Discussed with neurology Dr. Lucina Mellow at [facility] who recommends increase Lamictal to 200 twice daily or start a second agent such as Keppra.  Recommends overnight hospitalization and observation to ensure that seizures have stabilized as he may be having a cluster right now, and if he continues to remain confused he may  require transfer to [facility] at a later time.  I reviewed extensive prior neurology notes from [facility], does sound like his current seizures are typical for his prior pattern which are EEG proven, which is occurring more frequently.   [PS]    Clinical Course User Index [PS] Sharman Cheek, MD     ----------------------------------------- 11:56 PM on 03/08/2019 -----------------------------------------  On reevaluation, patient is awake and alert and oriented.  Feels back to normal.  He does have a cough which is nonproductive and also complains of body aches.  I will add a chest x-ray and flu test.  Discussed with hospitalist for further evaluation.  ____________________________________________   FINAL CLINICAL IMPRESSION(S) / ED DIAGNOSES    Final diagnoses:  Seizure (HCC)  Partial symptomatic epilepsy with complex partial seizures, not intractable, without status epilepticus (HCC)  Cough     ED Discharge Orders    None      Portions of this note were generated with dragon dictation software. Dictation  errors may occur despite best attempts at proofreading.   Sharman Cheek, MD 03/08/19 2356

## 2019-03-08 NOTE — ED Notes (Signed)
Urine attempted to be collected at this time.

## 2019-03-09 ENCOUNTER — Other Ambulatory Visit: Payer: Self-pay

## 2019-03-09 DIAGNOSIS — G40802 Other epilepsy, not intractable, without status epilepticus: Secondary | ICD-10-CM

## 2019-03-09 DIAGNOSIS — Z79899 Other long term (current) drug therapy: Secondary | ICD-10-CM | POA: Diagnosis not present

## 2019-03-09 DIAGNOSIS — R05 Cough: Secondary | ICD-10-CM | POA: Diagnosis not present

## 2019-03-09 DIAGNOSIS — G40209 Localization-related (focal) (partial) symptomatic epilepsy and epileptic syndromes with complex partial seizures, not intractable, without status epilepticus: Secondary | ICD-10-CM | POA: Diagnosis not present

## 2019-03-09 LAB — CBC
HCT: 48.4 % (ref 39.0–52.0)
Hemoglobin: 16.6 g/dL (ref 13.0–17.0)
MCH: 32.5 pg (ref 26.0–34.0)
MCHC: 34.3 g/dL (ref 30.0–36.0)
MCV: 94.7 fL (ref 80.0–100.0)
Platelets: 204 10*3/uL (ref 150–400)
RBC: 5.11 MIL/uL (ref 4.22–5.81)
RDW: 11.6 % (ref 11.5–15.5)
WBC: 7 10*3/uL (ref 4.0–10.5)
nRBC: 0 % (ref 0.0–0.2)

## 2019-03-09 LAB — BASIC METABOLIC PANEL
Anion gap: 12 (ref 5–15)
BUN: 17 mg/dL (ref 6–20)
CO2: 21 mmol/L — ABNORMAL LOW (ref 22–32)
Calcium: 9.1 mg/dL (ref 8.9–10.3)
Chloride: 106 mmol/L (ref 98–111)
Creatinine, Ser: 0.88 mg/dL (ref 0.61–1.24)
GFR calc Af Amer: >60 mL/min (ref >60)
GFR calc non Af Amer: >60 mL/min (ref >60)
Glucose, Bld: 66 mg/dL — ABNORMAL LOW (ref 70–99)
POTASSIUM: 4 mmol/L (ref 3.5–5.1)
Sodium: 139 mmol/L (ref 135–145)

## 2019-03-09 LAB — GLUCOSE, CAPILLARY
GLUCOSE-CAPILLARY: 53 mg/dL — AB (ref 70–99)
Glucose-Capillary: 66 mg/dL — ABNORMAL LOW (ref 70–99)
Glucose-Capillary: 112 mg/dL — ABNORMAL HIGH (ref 70–99)

## 2019-03-09 LAB — INFLUENZA PANEL BY PCR (TYPE A & B)
Influenza A By PCR: NEGATIVE
Influenza B By PCR: NEGATIVE

## 2019-03-09 MED ORDER — ACETAMINOPHEN 650 MG RE SUPP: 650.0000 mg | Freq: Four times a day (QID) | RECTAL | Status: DC | PRN

## 2019-03-09 MED ORDER — LORAZEPAM 2 MG/ML IJ SOLN: 0.5000 mg | INTRAMUSCULAR | Status: DC | PRN

## 2019-03-09 MED ORDER — LAMOTRIGINE 200 MG PO TABS: 200.0000 mg | ORAL_TABLET | Freq: Two times a day (BID) | ORAL | 0 refills | Status: AC

## 2019-03-09 MED ORDER — ONDANSETRON HCL 4 MG PO TABS: 4.0000 mg | ORAL_TABLET | Freq: Four times a day (QID) | ORAL | Status: DC | PRN

## 2019-03-09 MED ORDER — ACETAMINOPHEN 325 MG PO TABS: 650.0000 mg | ORAL_TABLET | Freq: Four times a day (QID) | ORAL | Status: DC | PRN

## 2019-03-09 MED ORDER — ENOXAPARIN SODIUM 40 MG/0.4ML ~~LOC~~ SOLN: 40.0000 mg | SUBCUTANEOUS | Status: DC

## 2019-03-09 MED ORDER — ONDANSETRON HCL 4 MG/2ML IJ SOLN: 4.0000 mg | Freq: Four times a day (QID) | INTRAMUSCULAR | Status: DC | PRN

## 2019-03-09 NOTE — Discharge Instructions (Addendum)
Resume diet and activity as before  Do not drive, operate heavy machinery, perform activities at heights and participate in water activities until released by outpatient physician.  Follow up with your Neurologist at [facility] within a week

## 2019-03-09 NOTE — ED Notes (Signed)
ED TO INPATIENT HANDOFF REPORT  ED Nurse Name and Phone #: [phone] [address]av[address] Batty Name/Age/Gender Henry Conley 43 y.o. male Room/Bed: ED14A/ED14A  Code Status   Code Status: Prior  Home/SNF/Other Home Patient oriented to: self, place, time and situation Is this baseline? Yes   Triage Complete: Triage complete  Chief Complaint Seizures  Triage Note Pt arrived via EMS from home where report that pt has had multiple seizures, 5 witnessed lasting 10-20 seconds each by EMS and 2 with this RN. Pt is able to follow commands on arrival. Pt drowsy. Pt received  Versed in route. Lamictal is seizure medication the pt is compliant with daily.    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 Frontal lobe epilepsy (HCC)   . Left leg numbness    Past Surgical History:  Procedure Laterality Date  . NO PAST SURGERIES       A IV Location/Drains/Wounds Patient Lines/Drains/Airways Status   Active Line/Drains/Airways    Name:   Placement date:   Placement time:   Site:   Days:   Peripheral IV 09/30/16 Right Forearm   09/30/16    1505    Forearm   890          Intake/Output Last 24 hours No intake or output data in the 24 hours ending 03/09/19 0053  Labs/Imaging Results for orders placed or performed during the hospital encounter of 03/08/19 (from the past 48 hour(s))  Comprehensive metabolic panel     Status: Abnormal   Collection Time: 03/08/19  8:27 PM  Result Value Ref Range   Sodium 140 135 -  145 mmol/L   Potassium 3.7 3.5 - 5.1 mmol/L   Chloride 107 98 - 111 mmol/L   CO2 23 22 - 32 mmol/L   Glucose, Bld 97 70 - 99 mg/dL   BUN 16 6 - 20 mg/dL   Creatinine, Ser 5.78 0.61 - 1.24 mg/dL   Calcium 8.9 8.9 - 46.9 mg/dL   Total Protein 7.8 6.5 - 8.1 g/dL   Albumin 4.5 3.5 - 5.0 g/dL   AST 60 (H) 15 - 41 U/L   ALT 49 (H) 0 - 44 U/L   Alkaline Phosphatase 58 38 - 126 U/L   Total Bilirubin 1.8 (H) 0.3 - 1.2 mg/dL   GFR calc non Af Amer >60 >60 mL/min   GFR calc Af Amer >60 >60 mL/min   Anion gap 10 5 - 15    Comment: Performed at [facility], [address] [place], [place] [address]  CBC with Differential     Status: None   Collection Time: 03/08/19  8:27 PM  Result Value Ref Range   WBC 7.8 4.0 - 10.5 K/uL   RBC 5.15 4.22 - 5.81 MIL/uL   Hemoglobin 16.9 13.0 - 17.0 g/dL   HCT 84.1 32.4 - 40.1 %   MCV 94.4 80.0 - 100.0 fL   MCH 32.8 26.0 - 34.0 pg   MCHC 34.8 30.0 - 36.0 g/dL   RDW 02.7 25.3 -  15.5 %   Platelets 208 150 - 400 K/uL   nRBC 0.0 0.0 - 0.2 %   Neutrophils Relative % 68 %   Neutro Abs 5.3 1.7 - 7.7 K/uL   Lymphocytes Relative 16 %   Lymphs Abs 1.2 0.7 - 4.0 K/uL   Monocytes Relative 11 %   Monocytes Absolute 0.9 0.1 - 1.0 K/uL   Eosinophils Relative 4 %   Eosinophils Absolute 0.3 0.0 - 0.5 K/uL   Basophils Relative 1 %   Basophils Absolute 0.0 0.0 - 0.1 K/uL   Immature Granulocytes 0 %   Abs Immature Granulocytes 0.01 0.00 - 0.07 K/uL    Comment: Performed at [facility], [address] [place], [place] [address]  Ethanol     Status: None   Collection Time: 03/08/19  8:27 PM  Result Value Ref Range   Alcohol, Ethyl (B) <10 <10 mg/dL    Comment: (NOTE) Lowest detectable limit for serum alcohol is 10 mg/dL. For medical purposes only. Performed at [facility], [address] [place], [place] [address]   Acetaminophen level     Status: Abnormal   Collection Time: 03/08/19  8:27 PM  Result Value Ref Range    Acetaminophen (Tylenol), Serum <10 (L) 10 - 30 ug/mL    Comment: (NOTE) Therapeutic concentrations vary significantly. A range of 10-30 ug/mL  may be an effective concentration for many patients. However, some  are best treated at concentrations outside of this range. Acetaminophen concentrations >150 ug/mL at 4 hours after ingestion  and >50 ug/mL at 12 hours after ingestion are often associated with  toxic reactions. Performed at [facility], [address] [place], [place] [address]   Salicylate level     Status: None   Collection Time: 03/08/19  8:27 PM  Result Value Ref Range   Salicylate Lvl <7.0 2.8 - 30.0 mg/dL    Comment: Performed at [facility], [address] [place], [place] [address]   Dg Chest Portable 1 View  Result Date: 03/08/2019 CLINICAL DATA:  Seizures. EXAM: PORTABLE CHEST 1 VIEW COMPARISON:  September 30, 2016 FINDINGS: The heart size and mediastinal contours are within normal limits. Both lungs are clear. The visualized skeletal structures are unremarkable. IMPRESSION: No active disease. Electronically Signed   By: Gerome Sam III M.D   On: 03/08/2019 23:31    Pending Labs Unresulted Labs (From admission, onward)    Start     Ordered   03/08/19 2301  Influenza panel by PCR (type A & B)  (Influenza PCR Panel)  Once,   STAT     03/08/19 2301   03/08/19 2027  Lamotrigine level  ONCE - STAT,   STAT     03/08/19 2027   03/08/19 2027  Urine Drug Screen, Qualitative  Once,   STAT     03/08/19 2027   Signed and Held  HIV antibody (Routine Testing)  Once,   R     Signed and Held   Signed and Held  CBC  (enoxaparin (LOVENOX)    CrCl >/= 30 ml/min)  Once,   R    Comments:  Baseline for enoxaparin therapy IF NOT ALREADY DRAWN.  Notify MD if PLT < 100 K.    Signed and Held   Signed and Held  Creatinine, serum  (enoxaparin (LOVENOX)    CrCl >/= 30 ml/min)  Once,   R    Comments:  Baseline for enoxaparin therapy IF NOT ALREADY DRAWN.  Signed  and Held   Signed and Held  Creatinine, serum  (enoxaparin (LOVENOX)    CrCl >/= 30 ml/min)  Weekly,   R    Comments:  while on enoxaparin therapy    Signed and Held   Signed and Held  Basic metabolic panel  Tomorrow morning,   R     Signed and Held   Signed and Held  CBC  Tomorrow morning,   R     Signed and Held          Vitals/Pain Today's Vitals   03/08/19 2025 03/09/19 0000 03/09/19 0030 03/09/19 0047  BP: (!) 138/96 113/77 123/85 123/85  Pulse: 87   87  Resp: 17 17 18 15   Temp: 98.6 F (37 C)     TempSrc: Oral     SpO2: 95%   96%    Isolation Precautions Droplet precaution  Medications Medications  lamoTRIgine (LAMICTAL) tablet 200 mg (200 mg Oral Given 03/09/19 0049)  sodium chloride 0.9 % bolus 1,000 mL (0 mLs Intravenous Stopped 03/09/19 0049)    Mobility walks     Focused Assessments Neuro Assessment Handoff:  Swallow screen pass? Yes  Cardiac Rhythm: Normal sinus rhythm       Neuro Assessment:   Neuro Checks:      Last Documented NIHSS Modified Score:   Has TPA been given? No If patient is a Neuro Trauma and patient is going to OR before floor call report to 4N Charge nurse: [phone] or [phone]     R Recommendations: See Admitting Provider Note  Report given to:   Additional Notes:

## 2019-03-09 NOTE — Progress Notes (Signed)
Hypoglycemic Event  CBG: 53. CBG  2nd recheck 66  Treatment: 4oz OJ  Symptoms: none  Follow-up CBG: Time:0943 CBG Result:112  Possible Reasons for Event: NPO  Comments/MD notified:Per pt no h/o DM. Pt was NPO. This Clinical research associate requested Dr Elpidio Anis if pt can have OJ and diet order.  MD ordered regular diet.    Henry Conley

## 2019-03-09 NOTE — Consult Note (Signed)
Reason for Consult:Seizure Referring Physician: Sudini  CC: Seizure  HPI: Henry Conley is an 43 y.o. male with a history seizures on Lamictal who presents with breakthrough seizure activity.  Patient reports that he has seizures daily.  Has multiple on yesterday.  Followed by neurology at [facility].   Has been on Gabapentin, Zonisamide and Keppra in the past  Past Medical History:  Diagnosis Date  . Frontal lobe epilepsy (HCC)   . Left leg numbness     Past Surgical History:  Procedure Laterality Date  . NO PAST SURGERIES      Family History  Problem Relation Age of Onset  . Heart disease Mother   . Healthy Father     Social History:  reports that he has never smoked. He has never used smokeless tobacco. He reports that he does not drink alcohol or use drugs.  No Known Allergies  Medications:  I have reviewed the patient's current medications. Prior to Admission:  Medications Prior to Admission  Medication Sig Dispense Refill Last Dose  . Multiple Vitamin (MULTI-VITAMINS) TABS Take 1 tablet by mouth every morning.    Taking  . phenytoin (DILANTIN) 300 MG ER capsule Take 1 capsule (300 mg total) by mouth at bedtime. 30 capsule 1 Taking   Scheduled: . enoxaparin (LOVENOX) injection  40 mg Subcutaneous Q24H  . lamoTRIgine  200 mg Oral BID    ROS: History obtained from the patient  General ROS: negative for - chills, fatigue, fever, night sweats, weight gain or weight loss Psychological ROS: negative for - behavioral disorder, hallucinations, memory difficulties, mood swings or suicidal ideation Ophthalmic ROS: negative for - blurry vision, double vision, eye pain or loss of vision ENT ROS: negative for - epistaxis, nasal discharge, oral lesions, sore throat, tinnitus or vertigo Allergy and Immunology ROS: negative for - hives or itchy/watery eyes Hematological and Lymphatic ROS: negative for - bleeding problems, bruising or swollen lymph nodes Endocrine ROS: negative  for - galactorrhea, hair pattern changes, polydipsia/polyuria or temperature intolerance Respiratory ROS: negative for - cough, hemoptysis, shortness of breath or wheezing Cardiovascular ROS: negative for - chest pain, dyspnea on exertion, edema or irregular heartbeat Gastrointestinal ROS: negative for - abdominal pain, diarrhea, hematemesis, nausea/vomiting or stool incontinence Genito-Urinary ROS: negative for - dysuria, hematuria, incontinence or urinary frequency/urgency Musculoskeletal ROS: negative for - joint swelling or muscular weakness Neurological ROS: as noted in HPI Dermatological ROS: negative for rash and skin lesion changes  Physical Examination: Blood pressure 126/78, pulse 86, temperature 98.1 F (36.7 C), temperature source Oral, resp. rate 18, height 5\' 10"  (1.778 m), weight 73.3 kg, SpO2 97 %.  HEENT-  Normocephalic, no lesions, without obvious abnormality.  Normal external eye and conjunctiva.  Normal TM's bilaterally.  Normal auditory canals and external ears. Normal external nose, mucus membranes and septum.  Normal pharynx. Cardiovascular- S1, S2 normal, pulses palpable throughout   Lungs- chest clear, no wheezing, rales, normal symmetric air entry Abdomen- soft, non-tender; bowel sounds normal; no masses,  no organomegaly Extremities- no edema Lymph-no adenopathy palpable Musculoskeletal-no joint tenderness, deformity or swelling Skin-warm and dry, no hyperpigmentation, vitiligo, or suspicious lesions  Neurological Examination   Mental Status: Lethargic, oriented, thought content appropriate.  Speech fluent without evidence of aphasia.  Able to follow 3 step commands without difficulty. Cranial Nerves: II: Discs flat bilaterally; Visual fields grossly normal, pupils equal, round, reactive to light and accommodation III,IV, VI: ptosis not present, extra-ocular motions intact bilaterally V,VII: smile symmetric, facial light touch  sensation normal bilaterally VIII:  hearing normal bilaterally IX,X: gag reflex present XI: bilateral shoulder shrug XII: midline tongue extension Motor: Right : Upper extremity   5/5    Left:     Upper extremity   5/5  Lower extremity   5/5     Lower extremity   5/5 Tone and bulk:normal tone throughout; no atrophy noted Sensory: Pinprick and light touch intact throughout, bilaterally Deep Tendon Reflexes: 2+ and symmetric throughout Plantars: Right: downgoing   Left: downgoing Cerebellar: Normal finger-to-nose and normal heel-to-shin testing bilaterally Gait: not tested due to safety concerns   Laboratory Studies:   Basic Metabolic Panel: Recent Labs  Lab 03/08/19 2027 03/09/19 0510  NA 140 139  K 3.7 4.0  CL 107 106  CO2 23 21*  GLUCOSE 97 66*  BUN 16 17  CREATININE 0.98 0.88  CALCIUM 8.9 9.1    Liver Function Tests: Recent Labs  Lab 03/08/19 2027  AST 60*  ALT 49*  ALKPHOS 58  BILITOT 1.8*  PROT 7.8  ALBUMIN 4.5   No results for input(s): LIPASE, AMYLASE in the last 168 hours. No results for input(s): AMMONIA in the last 168 hours.  CBC: Recent Labs  Lab 03/08/19 2027 03/09/19 0510  WBC 7.8 7.0  NEUTROABS 5.3  --   HGB 16.9 16.6  HCT 48.6 48.4  MCV 94.4 94.7  PLT 208 204    Cardiac Enzymes: No results for input(s): CKTOTAL, CKMB, CKMBINDEX, TROPONINI in the last 168 hours.  BNP: Invalid input(s): POCBNP  CBG: Recent Labs  Lab 03/09/19 0843 03/09/19 0846 03/09/19 0943  GLUCAP 53* 66* 112*    Microbiology: No results found for this or any previous visit.  Coagulation Studies: No results for input(s): LABPROT, INR in the last 72 hours.  Urinalysis: No results for input(s): COLORURINE, LABSPEC, PHURINE, GLUCOSEU, HGBUR, BILIRUBINUR, KETONESUR, PROTEINUR, UROBILINOGEN, NITRITE, LEUKOCYTESUR in the last 168 hours.  Invalid input(s): APPERANCEUR  Lipid Panel:  No results found for: CHOL, TRIG, HDL, CHOLHDL, VLDL, LDLCALC  HgbA1C:  Lab Results  Component Value Date    HGBA1C 5.4 10/01/2016    Urine Drug Screen:      Component Value Date/Time   LABOPIA NONE DETECTED 09/30/2016 1439   COCAINSCRNUR NONE DETECTED 09/30/2016 1439   LABBENZ NONE DETECTED 09/30/2016 1439   AMPHETMU NONE DETECTED 09/30/2016 1439   THCU NONE DETECTED 09/30/2016 1439   LABBARB NONE DETECTED 09/30/2016 1439    Alcohol Level:  Recent Labs  Lab 03/08/19 2027  ETH <10    Imaging: Dg Chest Portable 1 View  Result Date: 03/08/2019 CLINICAL DATA:  Seizures. EXAM: PORTABLE CHEST 1 VIEW COMPARISON:  September 30, 2016 FINDINGS: The heart size and mediastinal contours are within normal limits. Both lungs are clear. The visualized skeletal structures are unremarkable. IMPRESSION: No active disease. Electronically Signed   By: Gerome Sam III M.D   On: 03/08/2019 23:31    Assessment/Plan: 43 year old male with a history of seizures presenting with breakthrough seizures.  Patient on Dilantin and Lamictal at home.  Patient's outpatient physician contacted adjustment in Lamictal suggested.    Recommendations: 1.  Seizure precautions 2.  Increase Lamictal to 200mg  BID 3.  Patient unable to drive, operate heavy machinery, perform activities at heights and participate in water activities until release by outpatient physician. 4.  Patient to continue follow up with outpatient physician  Thana Farr, MD Neurology [phone] 03/09/2019, 11:50 AM

## 2019-03-09 NOTE — Progress Notes (Signed)
Pt is being discharged home.  AVS given and explained to pt. Pt verbalized understanding.  Rx to be picked up from pharmacy, pt made aware.

## 2019-03-10 LAB — HIV ANTIBODY (ROUTINE TESTING W REFLEX): HIV Screen 4th Generation wRfx: NONREACTIVE

## 2019-03-10 LAB — LAMOTRIGINE LEVEL: Lamotrigine Lvl: 1.9 ug/mL — ABNORMAL LOW (ref 2.0–20.0)

## 2019-03-18 NOTE — Discharge Summary (Signed)
[facility] - [place] [facility]   PATIENT NAME: Henry Conley    MR#:  [id number]  DATE OF BIRTH:  01-17-1976  DATE OF ADMISSION:  03/08/2019 ADMITTING PHYSICIAN: Oralia Manis, MD  DATE OF DISCHARGE: 03/09/2019 12:35 PM  PRIMARY CARE PHYSICIAN: No primary care provider on file.   ADMISSION DIAGNOSIS:  Cough [R05] Seizure (HCC) [R56.9] Partial symptomatic epilepsy with complex partial seizures, not intractable, without status epilepticus (HCC) [G40.209]  DISCHARGE DIAGNOSIS:  Principal Problem:   Frontal lobe epilepsy (HCC)   SECONDARY DIAGNOSIS:   Past Medical History:  Diagnosis Date  . Frontal lobe epilepsy (HCC)   . Left leg numbness      ADMITTING HISTORY  Henry Conley  is a 43 y.o. male who presents with chief complaint as above.  Patient presents to the ED due to increased seizure activity.  He has a history of frontal lobe epilepsy, and is followed by neurology at [facility].  At baseline he tends to have a few seizures a day.  His mother describes the seizures as brief, episodes where he will "drop and then get right back up".  She states that recently he has had increased frequency of seizures, and that now his seizures include right sided thoracic muscle cramping and hand contraction as well as leg contraction.  Patient is able to wake up and respond appropriately to questions tonight, but does not provide much information on history.  [facility] neurology was called by ED physician and they recommended increasing the patient's dose of Lamictal to 200 mg and observing him overnight.  This was given in the ED.  Hospitalist were called for admission.  HOSPITAL COURSE:   *Frontal lobe epilepsy with recurrent seizures.  Case was discussed from emergency room with patient's [facility] neurologist and his Lamictal dose was increased.  Patient was admitted for overnight monitoring.  No further seizures in the hospital.  Seen by Dr. Thad Ranger who agreed with plan of  [facility] neurology.  Patient was given prescriptions for increased dose of Lamictal at 200 mg twice daily.  Instructions given  Do not drive, operate heavy machinery, perform activities at heights and participate in water activities until release by outpatient physician.   Patient discharged home in stable condition to follow-up with [facility] neurology.  CONSULTS OBTAINED:  Treatment Team:  Kym Groom, MD  DRUG ALLERGIES:  No Known Allergies  DISCHARGE MEDICATIONS:   Allergies as of 03/09/2019   No Known Allergies     Medication List    STOP taking these medications   phenytoin 300 MG ER capsule Commonly known as:  DILANTIN     TAKE these medications   lamoTRIgine 200 MG tablet Commonly known as:  LAMICTAL Take 1 tablet (200 mg total) by mouth 2 (two) times daily.   Multi-Vitamins Tabs Take 1 tablet by mouth every morning.       Today   VITAL SIGNS:  Blood pressure 126/78, pulse 86, temperature 98.1 F (36.7 C), temperature source Oral, resp. rate 18, height 5\' 10"  (1.778 m), weight 73.3 kg, SpO2 97 %.  I/O:  No intake or output data in the 24 hours ending 03/18/19 1721  PHYSICAL EXAMINATION:  Physical Exam  GENERAL:  43 y.o.-year-old patient lying in the bed with no acute distress.  LUNGS: Normal breath sounds bilaterally, no wheezing, rales,rhonchi or crepitation. No use of accessory muscles of respiration.  CARDIOVASCULAR: S1, S2 normal. No murmurs, rubs, or gallops.  ABDOMEN: Soft, non-tender, non-distended. Bowel sounds present.  No organomegaly or mass.  NEUROLOGIC: Moves all 4 extremities. PSYCHIATRIC: The patient is alert and oriented x 3.  SKIN: No obvious rash, lesion, or ulcer.   DATA REVIEW:   CBC No results for input(s): WBC, HGB, HCT, PLT in the last 168 hours.  Chemistries  No results for input(s): NA, K, CL, CO2, GLUCOSE, BUN, CREATININE, CALCIUM, MG, AST, ALT, ALKPHOS, BILITOT in the last 168 hours.  Invalid input(s): GFRCGP  Cardiac  Enzymes No results for input(s): TROPONINI in the last 168 hours.  Microbiology Results  No results found for this or any previous visit.  RADIOLOGY:  No results found.  Follow up with PCP in 1 week.  Management plans discussed with the patient, family and they are in agreement.  CODE STATUS:  Code Status History    Date Active Date Inactive Code Status Order ID Comments User Context   03/09/2019 0153 03/09/2019 1549 Full Code [id number]  Oralia Manis, MD Inpatient   09/30/2016 1632 10/02/2016 1826 Full Code [id number]  Marguarite Arbour, MD Inpatient      TOTAL TIME TAKING CARE OF THIS PATIENT ON DAY OF DISCHARGE: more than 30 minutes.   Orie Fisherman M.D on 03/18/2019 at 5:21 PM  Between 7am to 6pm - Pager - [phone]  After 6pm go to [email or web address] - password [email or web address] [facility]  [facility] [place] [facility]  Office  [phone]  CC: Primary care physician; No primary care provider on file.  Note: This dictation was prepared with Dragon dictation along with smaller phrase technology. Any transcriptional errors that result from this process are unintentional.

## 2019-09-02 DIAGNOSIS — G40209 Localization-related (focal) (partial) symptomatic epilepsy and epileptic syndromes with complex partial seizures, not intractable, without status epilepticus: Secondary | ICD-10-CM | POA: Diagnosis not present

## 2019-10-02 ENCOUNTER — Emergency Department
Admission: EM | Admit: 2019-10-02 | Discharge: 2019-10-02 | Disposition: A | Discharge status: Home / Self Care | Payer: PRIVATE HEALTH INSURANCE | Attending: Emergency Medicine | Admitting: Emergency Medicine

## 2019-10-02 ENCOUNTER — Other Ambulatory Visit: Payer: Self-pay

## 2019-10-02 DIAGNOSIS — X150XXA Contact with hot stove (kitchen), initial encounter: Secondary | ICD-10-CM | POA: Diagnosis not present

## 2019-10-02 DIAGNOSIS — S6991XA Unspecified injury of right wrist, hand and finger(s), initial encounter: Secondary | ICD-10-CM | POA: Diagnosis present

## 2019-10-02 DIAGNOSIS — T23001A Burn of unspecified degree of right hand, unspecified site, initial encounter: Secondary | ICD-10-CM | POA: Diagnosis not present

## 2019-10-02 DIAGNOSIS — Y93G2 Activity, grilling and smoking food: Secondary | ICD-10-CM | POA: Insufficient documentation

## 2019-10-02 DIAGNOSIS — Y92511 Restaurant or cafe as the place of occurrence of the external cause: Secondary | ICD-10-CM | POA: Diagnosis not present

## 2019-10-02 DIAGNOSIS — Y999 Unspecified external cause status: Secondary | ICD-10-CM | POA: Insufficient documentation

## 2019-10-02 MED ORDER — DOXYCYCLINE HYCLATE 100 MG PO CAPS: 100.0000 mg | ORAL_CAPSULE | Freq: Two times a day (BID) | ORAL | 0 refills | Status: AC

## 2019-10-02 MED ORDER — DOXYCYCLINE HYCLATE 100 MG PO TABS
100.0000 mg | ORAL_TABLET | Freq: Once | ORAL | Status: AC
  Administered 2019-10-02: 100 mg via ORAL
  Filled 2019-10-02: qty 1

## 2019-10-02 MED ORDER — CEPHALEXIN 500 MG PO CAPS
500.0000 mg | ORAL_CAPSULE | Freq: Once | ORAL | Status: AC
  Administered 2019-10-02: 500 mg via ORAL
  Filled 2019-10-02: qty 1

## 2019-10-02 MED ORDER — SILVER SULFADIAZINE 1 % EX CREA
TOPICAL_CREAM | Freq: Once | CUTANEOUS | Status: AC
  Administered 2019-10-02: 16:00:00 via TOPICAL

## 2019-10-02 MED ORDER — SILVER SULFADIAZINE 1 % EX CREA: TOPICAL_CREAM | CUTANEOUS | 1 refills | Status: AC

## 2019-10-02 MED ORDER — CEPHALEXIN 500 MG PO CAPS: 500.0000 mg | ORAL_CAPSULE | Freq: Four times a day (QID) | ORAL | 0 refills | Status: AC
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
## 2019-10-02 NOTE — ED Notes (Signed)
Patient right hand wrapped with gauze.  No bleeding at this time.

## 2019-10-02 NOTE — ED Notes (Signed)
[facility]  CONSULT  LINE  CALLED  PER  DR  Jimmye Norman  MD

## 2019-10-07 LAB — AEROBIC/ANAEROBIC CULTURE W GRAM STAIN (SURGICAL/DEEP WOUND)
Gram Stain: NONE SEEN
Special Requests: NORMAL

## 2019-10-16 DIAGNOSIS — T23291A Burn of second degree of multiple sites of right wrist and hand, initial encounter: Secondary | ICD-10-CM | POA: Diagnosis not present

## 2019-10-16 DIAGNOSIS — T23231A Burn of second degree of multiple right fingers (nail), not including thumb, initial encounter: Secondary | ICD-10-CM | POA: Diagnosis not present

## 2019-10-23 DIAGNOSIS — T23231D Burn of second degree of multiple right fingers (nail), not including thumb, subsequent encounter: Secondary | ICD-10-CM | POA: Diagnosis not present

## 2019-10-23 DIAGNOSIS — T310 Burns involving less than 10% of body surface: Secondary | ICD-10-CM | POA: Diagnosis not present

## 2019-10-23 DIAGNOSIS — T23291D Burn of second degree of multiple sites of right wrist and hand, subsequent encounter: Secondary | ICD-10-CM | POA: Diagnosis not present

## 2019-10-23 DIAGNOSIS — T23201A Burn of second degree of right hand, unspecified site, initial encounter: Secondary | ICD-10-CM | POA: Diagnosis not present

## 2020-07-27 DIAGNOSIS — G40209 Localization-related (focal) (partial) symptomatic epilepsy and epileptic syndromes with complex partial seizures, not intractable, without status epilepticus: Secondary | ICD-10-CM | POA: Diagnosis not present

## 2020-08-24 DIAGNOSIS — G40209 Localization-related (focal) (partial) symptomatic epilepsy and epileptic syndromes with complex partial seizures, not intractable, without status epilepticus: Secondary | ICD-10-CM | POA: Diagnosis not present

## 2021-02-13 ENCOUNTER — Other Ambulatory Visit: Payer: Self-pay | Admitting: Clinical Neurophysiology

## 2021-03-21 ENCOUNTER — Other Ambulatory Visit: Payer: Self-pay | Admitting: Clinical Neurophysiology

## 2021-04-06 ENCOUNTER — Other Ambulatory Visit: Payer: Self-pay

## 2021-04-06 MED ORDER — LAMOTRIGINE 25 MG PO TABS
ORAL_TABLET | ORAL | 1 refills | Status: DC
  Filled 2021-04-06: qty 360, 90d supply, fill #0
  Filled 2021-07-10: qty 348, 87d supply, fill #1
  Filled 2021-07-10: qty 12, 3d supply, fill #1

## 2021-04-13 ENCOUNTER — Other Ambulatory Visit: Payer: Self-pay

## 2021-04-13 MED FILL — Clobazam Tab 20 MG: ORAL | 30 days supply | Qty: 60 | Fill #0 | Status: AC

## 2021-05-15 ENCOUNTER — Other Ambulatory Visit: Payer: Self-pay

## 2021-05-15 MED FILL — Clobazam Tab 20 MG: ORAL | 30 days supply | Qty: 60 | Fill #1 | Status: AC

## 2021-06-14 ENCOUNTER — Other Ambulatory Visit: Payer: Self-pay

## 2021-06-14 MED FILL — Clobazam Tab 20 MG: ORAL | 30 days supply | Qty: 60 | Fill #2 | Status: AC

## 2021-06-20 ENCOUNTER — Other Ambulatory Visit: Payer: Self-pay

## 2021-06-20 MED FILL — Lamotrigine Tab 200 MG: ORAL | 90 days supply | Qty: 180 | Fill #0 | Status: AC

## 2021-07-10 ENCOUNTER — Other Ambulatory Visit: Payer: Self-pay

## 2021-07-10 MED FILL — Lamotrigine Tab 200 MG: ORAL | 90 days supply | Qty: 180 | Fill #1 | Status: CN

## 2021-07-11 ENCOUNTER — Other Ambulatory Visit: Payer: Self-pay

## 2021-07-12 ENCOUNTER — Other Ambulatory Visit: Payer: Self-pay

## 2021-07-12 DIAGNOSIS — G40209 Localization-related (focal) (partial) symptomatic epilepsy and epileptic syndromes with complex partial seizures, not intractable, without status epilepticus: Secondary | ICD-10-CM | POA: Diagnosis not present

## 2021-07-12 MED ORDER — LACOSAMIDE 50 MG PO TABS
ORAL_TABLET | ORAL | 5 refills | Status: DC
  Filled 2021-07-12 (×2): qty 120, 30d supply, fill #0
  Filled 2021-07-13: qty 60, 30d supply, fill #0
  Filled 2021-08-03: qty 60, 30d supply, fill #1

## 2021-07-13 ENCOUNTER — Other Ambulatory Visit: Payer: Self-pay

## 2021-07-13 MED ORDER — CLOBAZAM 20 MG PO TABS
20.0000 mg | ORAL_TABLET | Freq: Two times a day (BID) | ORAL | 4 refills | Status: DC
  Filled 2021-07-13: qty 60, 30d supply, fill #0
  Filled 2021-08-14: qty 60, 30d supply, fill #1
  Filled 2021-09-11: qty 60, 30d supply, fill #2
  Filled 2021-10-09: qty 60, 30d supply, fill #3
  Filled 2021-11-08: qty 60, 30d supply, fill #4

## 2021-08-03 ENCOUNTER — Other Ambulatory Visit: Payer: Self-pay

## 2021-08-03 MED ORDER — LACOSAMIDE 100 MG PO TABS
ORAL_TABLET | ORAL | 1 refills | Status: DC
  Filled 2021-08-03: qty 60, 30d supply, fill #0
  Filled 2021-08-04: qty 120, 60d supply, fill #0
  Filled 2021-10-31: qty 180, 90d supply, fill #1

## 2021-08-04 ENCOUNTER — Other Ambulatory Visit: Payer: Self-pay

## 2021-08-07 ENCOUNTER — Other Ambulatory Visit: Payer: Self-pay

## 2021-08-14 ENCOUNTER — Other Ambulatory Visit: Payer: Self-pay

## 2021-09-11 ENCOUNTER — Other Ambulatory Visit: Payer: Self-pay

## 2021-09-11 MED FILL — Lamotrigine Tab 200 MG: ORAL | 90 days supply | Qty: 180 | Fill #1 | Status: AC

## 2021-10-06 ENCOUNTER — Other Ambulatory Visit: Payer: Self-pay

## 2021-10-06 MED ORDER — LAMOTRIGINE 25 MG PO TABS
ORAL_TABLET | ORAL | 1 refills | Status: AC
  Filled 2021-10-06: qty 18, 6d supply, fill #0
  Filled 2021-10-09: qty 360, 90d supply, fill #0
  Filled 2022-01-03: qty 360, 90d supply, fill #1

## 2021-10-09 ENCOUNTER — Other Ambulatory Visit: Payer: Self-pay

## 2021-10-31 ENCOUNTER — Other Ambulatory Visit: Payer: Self-pay

## 2021-11-01 ENCOUNTER — Other Ambulatory Visit: Payer: Self-pay

## 2021-11-08 ENCOUNTER — Other Ambulatory Visit: Payer: Self-pay

## 2021-12-11 ENCOUNTER — Other Ambulatory Visit: Payer: Self-pay

## 2021-12-11 MED ORDER — CLOBAZAM 20 MG PO TABS
20.0000 mg | ORAL_TABLET | Freq: Two times a day (BID) | ORAL | 4 refills | Status: DC
  Filled 2021-12-11: qty 60, 30d supply, fill #0
  Filled 2022-01-08: qty 60, 30d supply, fill #1
  Filled 2022-02-06: qty 60, 30d supply, fill #2
  Filled 2022-03-08: qty 60, 30d supply, fill #3
  Filled 2022-04-09: qty 60, 30d supply, fill #4

## 2021-12-11 MED FILL — Lamotrigine Tab 200 MG: ORAL | 90 days supply | Qty: 180 | Fill #2 | Status: AC

## 2022-01-03 ENCOUNTER — Other Ambulatory Visit: Payer: Self-pay

## 2022-01-04 ENCOUNTER — Other Ambulatory Visit: Payer: Self-pay

## 2022-01-08 ENCOUNTER — Other Ambulatory Visit: Payer: Self-pay

## 2022-01-29 ENCOUNTER — Other Ambulatory Visit: Payer: Self-pay

## 2022-01-30 ENCOUNTER — Other Ambulatory Visit: Payer: Self-pay

## 2022-01-30 MED ORDER — LACOSAMIDE 100 MG PO TABS
ORAL_TABLET | ORAL | 1 refills | Status: DC
  Filled 2022-01-30: qty 180, 90d supply, fill #0
  Filled 2022-04-26: qty 180, 90d supply, fill #1

## 2022-01-31 ENCOUNTER — Other Ambulatory Visit: Payer: Self-pay

## 2022-02-06 ENCOUNTER — Other Ambulatory Visit: Payer: Self-pay

## 2022-03-08 ENCOUNTER — Other Ambulatory Visit: Payer: Self-pay

## 2022-03-13 ENCOUNTER — Other Ambulatory Visit: Payer: Self-pay

## 2022-03-13 MED ORDER — LAMOTRIGINE 200 MG PO TABS
200.0000 mg | ORAL_TABLET | Freq: Two times a day (BID) | ORAL | 3 refills | Status: DC
  Filled 2022-03-13: qty 180, 90d supply, fill #0
  Filled 2022-06-06: qty 180, 90d supply, fill #1
  Filled 2022-07-06 – 2022-09-06 (×2): qty 180, 90d supply, fill #2
  Filled 2022-12-10: qty 180, 90d supply, fill #3

## 2022-04-05 DIAGNOSIS — S0101XA Laceration without foreign body of scalp, initial encounter: Secondary | ICD-10-CM | POA: Diagnosis not present

## 2022-04-06 ENCOUNTER — Other Ambulatory Visit: Payer: Self-pay

## 2022-04-09 ENCOUNTER — Other Ambulatory Visit: Payer: Self-pay

## 2022-04-09 MED ORDER — LAMOTRIGINE 25 MG PO TABS
ORAL_TABLET | ORAL | 1 refills | Status: DC
  Filled 2022-04-09: qty 360, 90d supply, fill #0
  Filled 2022-07-06: qty 14, 3d supply, fill #1
  Filled 2022-07-06: qty 346, 87d supply, fill #1

## 2022-04-10 ENCOUNTER — Other Ambulatory Visit: Payer: Self-pay

## 2022-04-26 ENCOUNTER — Other Ambulatory Visit: Payer: Self-pay

## 2022-04-27 ENCOUNTER — Other Ambulatory Visit: Payer: Self-pay

## 2022-05-02 DIAGNOSIS — G40019 Localization-related (focal) (partial) idiopathic epilepsy and epileptic syndromes with seizures of localized onset, intractable, without status epilepticus: Secondary | ICD-10-CM | POA: Diagnosis not present

## 2022-05-08 ENCOUNTER — Other Ambulatory Visit: Payer: Self-pay

## 2022-05-08 MED ORDER — CLOBAZAM 20 MG PO TABS
ORAL_TABLET | ORAL | 5 refills | Status: DC
  Filled 2022-05-08: qty 45, 30d supply, fill #0
  Filled 2022-06-06: qty 45, 30d supply, fill #1
  Filled 2022-07-06: qty 45, 30d supply, fill #2
  Filled 2022-08-08: qty 45, 30d supply, fill #3
  Filled 2022-09-06: qty 45, 30d supply, fill #4
  Filled 2022-10-04: qty 45, 30d supply, fill #5

## 2022-06-06 ENCOUNTER — Other Ambulatory Visit: Payer: Self-pay

## 2022-07-05 ENCOUNTER — Other Ambulatory Visit: Payer: Self-pay

## 2022-07-06 ENCOUNTER — Other Ambulatory Visit: Payer: Self-pay

## 2022-07-09 ENCOUNTER — Other Ambulatory Visit: Payer: Self-pay

## 2022-07-25 ENCOUNTER — Other Ambulatory Visit: Payer: Self-pay

## 2022-07-27 ENCOUNTER — Other Ambulatory Visit: Payer: Self-pay

## 2022-07-27 MED ORDER — LACOSAMIDE 100 MG PO TABS
ORAL_TABLET | ORAL | 5 refills | Status: DC
  Filled 2022-07-27: qty 60, 30d supply, fill #0
  Filled 2022-08-27: qty 60, 30d supply, fill #1
  Filled 2022-09-25: qty 60, 30d supply, fill #2
  Filled 2022-10-25: qty 60, 30d supply, fill #3
  Filled 2022-11-26: qty 60, 30d supply, fill #4
  Filled 2022-12-26: qty 60, 30d supply, fill #5

## 2022-08-08 ENCOUNTER — Other Ambulatory Visit: Payer: Self-pay

## 2022-08-27 ENCOUNTER — Other Ambulatory Visit: Payer: Self-pay

## 2022-09-06 ENCOUNTER — Other Ambulatory Visit: Payer: Self-pay

## 2022-09-25 ENCOUNTER — Other Ambulatory Visit: Payer: Self-pay

## 2022-10-04 ENCOUNTER — Other Ambulatory Visit: Payer: Self-pay

## 2022-10-04 MED ORDER — LAMOTRIGINE 25 MG PO TABS
ORAL_TABLET | ORAL | 1 refills | Status: DC
  Filled 2022-10-04: qty 360, 90d supply, fill #0
  Filled 2023-01-03 (×2): qty 360, 90d supply, fill #1

## 2022-10-05 ENCOUNTER — Other Ambulatory Visit: Payer: Self-pay

## 2022-10-25 ENCOUNTER — Other Ambulatory Visit: Payer: Self-pay

## 2022-11-07 ENCOUNTER — Other Ambulatory Visit: Payer: Self-pay

## 2022-11-07 MED ORDER — CLOBAZAM 20 MG PO TABS
ORAL_TABLET | ORAL | 5 refills | Status: DC
  Filled 2022-11-07: qty 45, 30d supply, fill #0
  Filled 2022-12-10: qty 45, 30d supply, fill #1
  Filled 2023-01-16: qty 45, 30d supply, fill #2
  Filled 2023-02-18: qty 45, 30d supply, fill #3
  Filled 2023-03-25: qty 45, 30d supply, fill #4
  Filled 2023-05-06: qty 45, 30d supply, fill #5

## 2022-11-13 DIAGNOSIS — G40019 Localization-related (focal) (partial) idiopathic epilepsy and epileptic syndromes with seizures of localized onset, intractable, without status epilepticus: Secondary | ICD-10-CM | POA: Diagnosis not present

## 2022-11-21 ENCOUNTER — Other Ambulatory Visit: Payer: Self-pay

## 2022-11-26 ENCOUNTER — Other Ambulatory Visit: Payer: Self-pay

## 2022-12-10 ENCOUNTER — Other Ambulatory Visit: Payer: Self-pay

## 2022-12-26 ENCOUNTER — Other Ambulatory Visit: Payer: Self-pay

## 2023-01-03 ENCOUNTER — Other Ambulatory Visit: Payer: Self-pay

## 2023-01-07 ENCOUNTER — Other Ambulatory Visit: Payer: Self-pay

## 2023-01-16 ENCOUNTER — Other Ambulatory Visit: Payer: Self-pay

## 2023-01-24 ENCOUNTER — Other Ambulatory Visit: Payer: Self-pay

## 2023-01-24 MED ORDER — LACOSAMIDE 100 MG PO TABS
100.0000 mg | ORAL_TABLET | Freq: Two times a day (BID) | ORAL | 3 refills | Status: AC
  Filled 2023-01-24: qty 180, 90d supply, fill #0
  Filled 2023-04-24: qty 180, 90d supply, fill #1
  Filled 2023-07-24: qty 180, 90d supply, fill #2
  Filled 2023-10-23: qty 180, 90d supply, fill #3

## 2023-01-25 ENCOUNTER — Other Ambulatory Visit: Payer: Self-pay

## 2023-02-18 ENCOUNTER — Other Ambulatory Visit: Payer: Self-pay

## 2023-02-25 ENCOUNTER — Other Ambulatory Visit: Payer: Self-pay

## 2023-02-25 DIAGNOSIS — U071 COVID-19: Secondary | ICD-10-CM | POA: Diagnosis not present

## 2023-02-25 DIAGNOSIS — J029 Acute pharyngitis, unspecified: Secondary | ICD-10-CM | POA: Diagnosis not present

## 2023-02-25 MED ORDER — LAGEVRIO 200 MG PO CAPS
4.0000 | ORAL_CAPSULE | Freq: Two times a day (BID) | ORAL | 0 refills | Status: AC
  Filled 2023-02-25: qty 40, 5d supply, fill #0

## 2023-03-07 ENCOUNTER — Other Ambulatory Visit: Payer: Self-pay

## 2023-03-07 MED ORDER — LAMOTRIGINE 200 MG PO TABS
200.0000 mg | ORAL_TABLET | Freq: Two times a day (BID) | ORAL | 3 refills | Status: DC
  Filled 2023-03-07: qty 180, 90d supply, fill #0
  Filled 2023-06-04: qty 180, 90d supply, fill #1
  Filled 2023-09-05: qty 180, 90d supply, fill #2
  Filled 2023-12-03: qty 180, 90d supply, fill #3

## 2023-03-25 ENCOUNTER — Other Ambulatory Visit: Payer: Self-pay

## 2023-04-02 ENCOUNTER — Other Ambulatory Visit: Payer: Self-pay

## 2023-04-02 MED ORDER — LAMOTRIGINE 25 MG PO TABS
50.0000 mg | ORAL_TABLET | Freq: Two times a day (BID) | ORAL | 1 refills | Status: DC
  Filled 2023-04-02: qty 360, 90d supply, fill #0
  Filled 2023-07-02: qty 360, 90d supply, fill #1

## 2023-04-03 ENCOUNTER — Other Ambulatory Visit: Payer: Self-pay

## 2023-04-24 ENCOUNTER — Other Ambulatory Visit: Payer: Self-pay

## 2023-04-29 ENCOUNTER — Other Ambulatory Visit: Payer: Self-pay

## 2023-05-06 ENCOUNTER — Other Ambulatory Visit: Payer: Self-pay

## 2023-06-04 ENCOUNTER — Other Ambulatory Visit: Payer: Self-pay

## 2023-06-07 ENCOUNTER — Other Ambulatory Visit: Payer: Self-pay

## 2023-06-25 ENCOUNTER — Other Ambulatory Visit: Payer: Self-pay

## 2023-06-27 ENCOUNTER — Other Ambulatory Visit: Payer: Self-pay

## 2023-06-27 MED ORDER — CLOBAZAM 20 MG PO TABS
ORAL_TABLET | ORAL | 5 refills | Status: AC
  Filled 2023-06-27: qty 45, 30d supply, fill #0
  Filled 2023-08-07: qty 45, 30d supply, fill #1
  Filled 2023-09-30: qty 45, 30d supply, fill #2
  Filled 2023-11-15: qty 45, 30d supply, fill #3

## 2023-07-09 DIAGNOSIS — G40019 Localization-related (focal) (partial) idiopathic epilepsy and epileptic syndromes with seizures of localized onset, intractable, without status epilepticus: Secondary | ICD-10-CM | POA: Diagnosis not present

## 2023-08-07 ENCOUNTER — Other Ambulatory Visit: Payer: Self-pay

## 2023-09-30 ENCOUNTER — Other Ambulatory Visit: Payer: Self-pay

## 2023-09-30 MED ORDER — LAMOTRIGINE 25 MG PO TABS
50.0000 mg | ORAL_TABLET | Freq: Two times a day (BID) | ORAL | 1 refills | Status: DC
  Filled 2023-09-30: qty 360, 90d supply, fill #0
  Filled 2023-12-30: qty 360, 90d supply, fill #1

## 2023-10-23 ENCOUNTER — Other Ambulatory Visit: Payer: Self-pay

## 2023-10-24 ENCOUNTER — Other Ambulatory Visit: Payer: Self-pay

## 2023-10-25 ENCOUNTER — Other Ambulatory Visit: Payer: Self-pay

## 2023-11-15 ENCOUNTER — Other Ambulatory Visit: Payer: Self-pay

## 2023-11-26 DIAGNOSIS — G40019 Localization-related (focal) (partial) idiopathic epilepsy and epileptic syndromes with seizures of localized onset, intractable, without status epilepticus: Secondary | ICD-10-CM | POA: Diagnosis not present

## 2023-12-05 ENCOUNTER — Other Ambulatory Visit: Payer: Self-pay

## 2024-01-09 ENCOUNTER — Other Ambulatory Visit: Payer: Self-pay

## 2024-01-10 ENCOUNTER — Other Ambulatory Visit: Payer: Self-pay

## 2024-01-13 ENCOUNTER — Other Ambulatory Visit: Payer: Self-pay

## 2024-01-14 ENCOUNTER — Other Ambulatory Visit: Payer: Self-pay

## 2024-01-14 MED ORDER — LACOSAMIDE 150 MG PO TABS
150.0000 mg | ORAL_TABLET | Freq: Two times a day (BID) | ORAL | 1 refills | Status: AC
  Filled 2024-01-14: qty 180, 90d supply, fill #0
  Filled 2024-04-13: qty 180, 90d supply, fill #1

## 2024-01-15 ENCOUNTER — Other Ambulatory Visit: Payer: Self-pay

## 2024-03-03 ENCOUNTER — Other Ambulatory Visit: Payer: Self-pay

## 2024-03-03 MED ORDER — LAMOTRIGINE 200 MG PO TABS
200.0000 mg | ORAL_TABLET | Freq: Two times a day (BID) | ORAL | 3 refills | Status: AC
  Filled 2024-03-03: qty 180, 90d supply, fill #0
  Filled 2024-06-01: qty 180, 90d supply, fill #1
  Filled 2024-09-01: qty 180, 90d supply, fill #2
  Filled 2024-11-30: qty 180, 90d supply, fill #3

## 2024-03-19 DIAGNOSIS — G40019 Localization-related (focal) (partial) idiopathic epilepsy and epileptic syndromes with seizures of localized onset, intractable, without status epilepticus: Secondary | ICD-10-CM | POA: Diagnosis not present

## 2024-03-30 ENCOUNTER — Other Ambulatory Visit: Payer: Self-pay

## 2024-03-30 MED ORDER — LAMOTRIGINE 25 MG PO TABS
50.0000 mg | ORAL_TABLET | Freq: Two times a day (BID) | ORAL | 1 refills | Status: DC
  Filled 2024-03-30: qty 360, 90d supply, fill #0
  Filled 2024-06-25: qty 360, 90d supply, fill #1

## 2024-03-31 ENCOUNTER — Other Ambulatory Visit: Payer: Self-pay

## 2024-04-13 ENCOUNTER — Other Ambulatory Visit: Payer: Self-pay

## 2024-04-14 ENCOUNTER — Other Ambulatory Visit: Payer: Self-pay

## 2024-04-15 ENCOUNTER — Other Ambulatory Visit: Payer: Self-pay

## 2024-04-15 MED ORDER — LACOSAMIDE 150 MG PO TABS
150.0000 mg | ORAL_TABLET | Freq: Two times a day (BID) | ORAL | 3 refills | Status: AC
  Filled 2024-04-15 – 2024-07-13 (×2): qty 180, 90d supply, fill #0
  Filled 2024-07-14: qty 120, 60d supply, fill #0
  Filled 2024-09-08 – 2024-09-10 (×2): qty 120, 60d supply, fill #1
  Filled 2024-11-09: qty 120, 60d supply, fill #2
  Filled 2025-01-11 (×2): qty 60, 30d supply, fill #3

## 2024-07-13 ENCOUNTER — Other Ambulatory Visit: Payer: Self-pay

## 2024-07-13 MED ORDER — LACOSAMIDE 150 MG PO TABS
150.0000 mg | ORAL_TABLET | Freq: Two times a day (BID) | ORAL | 3 refills | Status: AC
  Filled 2024-07-13: qty 180, 90d supply, fill #0

## 2024-07-14 ENCOUNTER — Other Ambulatory Visit: Payer: Self-pay

## 2024-07-15 ENCOUNTER — Other Ambulatory Visit (HOSPITAL_COMMUNITY): Payer: Self-pay

## 2024-09-08 ENCOUNTER — Other Ambulatory Visit: Payer: Self-pay

## 2024-09-15 ENCOUNTER — Other Ambulatory Visit: Payer: Self-pay

## 2024-09-15 DIAGNOSIS — G40019 Localization-related (focal) (partial) idiopathic epilepsy and epileptic syndromes with seizures of localized onset, intractable, without status epilepticus: Secondary | ICD-10-CM | POA: Diagnosis not present

## 2024-09-15 MED ORDER — LACOSAMIDE 50 MG PO TABS
50.0000 mg | ORAL_TABLET | Freq: Every evening | ORAL | 1 refills | Status: AC
  Filled 2024-09-15: qty 90, 90d supply, fill #0
  Filled 2025-01-20: qty 90, 90d supply, fill #1

## 2024-09-25 ENCOUNTER — Other Ambulatory Visit: Payer: Self-pay

## 2024-09-27 ENCOUNTER — Other Ambulatory Visit (HOSPITAL_COMMUNITY): Payer: Self-pay

## 2024-09-28 ENCOUNTER — Other Ambulatory Visit: Payer: Self-pay

## 2024-09-29 ENCOUNTER — Other Ambulatory Visit: Payer: Self-pay

## 2024-09-29 MED ORDER — LAMOTRIGINE 25 MG PO TABS
50.0000 mg | ORAL_TABLET | Freq: Two times a day (BID) | ORAL | 3 refills | Status: AC
  Filled 2024-09-29: qty 360, 90d supply, fill #0
  Filled 2024-12-25: qty 360, 90d supply, fill #1

## 2025-01-11 ENCOUNTER — Other Ambulatory Visit: Payer: Self-pay

## 2025-01-20 ENCOUNTER — Other Ambulatory Visit: Payer: Self-pay

## 2025-01-26 ENCOUNTER — Other Ambulatory Visit: Payer: Self-pay
